# Patient Record
Sex: Male | Born: 1950 | Race: White | Hispanic: No | Marital: Married | Smoking: Never smoker
Health system: Southern US, Community
[De-identification: ages and names within clinical notes are randomized; demographics above are authoritative.]

## PROBLEM LIST (undated history)

## (undated) DIAGNOSIS — I10 Essential (primary) hypertension: Secondary | ICD-10-CM

## (undated) DIAGNOSIS — E785 Hyperlipidemia, unspecified: Secondary | ICD-10-CM

## (undated) HISTORY — PX: TONSILLECTOMY: SUR1361

## (undated) HISTORY — DX: Hyperlipidemia, unspecified: E78.5

## (undated) HISTORY — DX: Essential (primary) hypertension: I10

---

## 2003-12-13 ENCOUNTER — Other Ambulatory Visit: Payer: Self-pay

## 2003-12-22 ENCOUNTER — Ambulatory Visit: Payer: Self-pay | Admitting: Otolaryngology

## 2014-09-08 ENCOUNTER — Encounter: Payer: Self-pay | Admitting: Gastroenterology

## 2014-11-14 ENCOUNTER — Encounter: Payer: Self-pay | Admitting: Gastroenterology

## 2014-11-29 ENCOUNTER — Encounter: Payer: Self-pay | Admitting: Gastroenterology

## 2019-04-07 DIAGNOSIS — H5213 Myopia, bilateral: Secondary | ICD-10-CM | POA: Diagnosis not present

## 2019-04-08 DIAGNOSIS — I10 Essential (primary) hypertension: Secondary | ICD-10-CM | POA: Diagnosis not present

## 2019-04-08 DIAGNOSIS — R42 Dizziness and giddiness: Secondary | ICD-10-CM | POA: Diagnosis not present

## 2019-04-08 DIAGNOSIS — Z6828 Body mass index (BMI) 28.0-28.9, adult: Secondary | ICD-10-CM | POA: Diagnosis not present

## 2019-04-08 DIAGNOSIS — R002 Palpitations: Secondary | ICD-10-CM | POA: Diagnosis not present

## 2019-04-12 DIAGNOSIS — H2513 Age-related nuclear cataract, bilateral: Secondary | ICD-10-CM | POA: Diagnosis not present

## 2019-04-12 DIAGNOSIS — H18602 Keratoconus, unspecified, left eye: Secondary | ICD-10-CM | POA: Diagnosis not present

## 2019-04-12 DIAGNOSIS — H25013 Cortical age-related cataract, bilateral: Secondary | ICD-10-CM | POA: Diagnosis not present

## 2019-04-12 DIAGNOSIS — H25043 Posterior subcapsular polar age-related cataract, bilateral: Secondary | ICD-10-CM | POA: Diagnosis not present

## 2019-04-12 DIAGNOSIS — H18601 Keratoconus, unspecified, right eye: Secondary | ICD-10-CM | POA: Diagnosis not present

## 2019-04-13 DIAGNOSIS — Z0100 Encounter for examination of eyes and vision without abnormal findings: Secondary | ICD-10-CM | POA: Diagnosis not present

## 2019-04-29 ENCOUNTER — Ambulatory Visit: Payer: Self-pay | Attending: Internal Medicine

## 2019-05-10 DIAGNOSIS — Z0000 Encounter for general adult medical examination without abnormal findings: Secondary | ICD-10-CM | POA: Diagnosis not present

## 2019-05-10 DIAGNOSIS — E782 Mixed hyperlipidemia: Secondary | ICD-10-CM | POA: Diagnosis not present

## 2019-05-10 DIAGNOSIS — Z125 Encounter for screening for malignant neoplasm of prostate: Secondary | ICD-10-CM | POA: Diagnosis not present

## 2019-05-10 DIAGNOSIS — Z20828 Contact with and (suspected) exposure to other viral communicable diseases: Secondary | ICD-10-CM | POA: Diagnosis not present

## 2019-05-10 DIAGNOSIS — Z0184 Encounter for antibody response examination: Secondary | ICD-10-CM | POA: Diagnosis not present

## 2019-05-13 DIAGNOSIS — R739 Hyperglycemia, unspecified: Secondary | ICD-10-CM | POA: Diagnosis not present

## 2019-05-13 DIAGNOSIS — Z23 Encounter for immunization: Secondary | ICD-10-CM | POA: Diagnosis not present

## 2019-05-13 DIAGNOSIS — Z6830 Body mass index (BMI) 30.0-30.9, adult: Secondary | ICD-10-CM | POA: Diagnosis not present

## 2019-05-13 DIAGNOSIS — Z1211 Encounter for screening for malignant neoplasm of colon: Secondary | ICD-10-CM | POA: Diagnosis not present

## 2019-05-13 DIAGNOSIS — Z0000 Encounter for general adult medical examination without abnormal findings: Secondary | ICD-10-CM | POA: Diagnosis not present

## 2019-06-02 DIAGNOSIS — H25013 Cortical age-related cataract, bilateral: Secondary | ICD-10-CM | POA: Diagnosis not present

## 2019-06-02 DIAGNOSIS — H2512 Age-related nuclear cataract, left eye: Secondary | ICD-10-CM | POA: Diagnosis not present

## 2019-06-02 DIAGNOSIS — H25043 Posterior subcapsular polar age-related cataract, bilateral: Secondary | ICD-10-CM | POA: Diagnosis not present

## 2019-06-02 DIAGNOSIS — H2513 Age-related nuclear cataract, bilateral: Secondary | ICD-10-CM | POA: Diagnosis not present

## 2019-06-03 DIAGNOSIS — Z20828 Contact with and (suspected) exposure to other viral communicable diseases: Secondary | ICD-10-CM | POA: Diagnosis not present

## 2019-07-13 DIAGNOSIS — H25011 Cortical age-related cataract, right eye: Secondary | ICD-10-CM | POA: Diagnosis not present

## 2019-07-13 DIAGNOSIS — H2511 Age-related nuclear cataract, right eye: Secondary | ICD-10-CM | POA: Diagnosis not present

## 2019-07-13 DIAGNOSIS — Z961 Presence of intraocular lens: Secondary | ICD-10-CM | POA: Diagnosis not present

## 2019-07-13 DIAGNOSIS — H25041 Posterior subcapsular polar age-related cataract, right eye: Secondary | ICD-10-CM | POA: Diagnosis not present

## 2019-07-13 DIAGNOSIS — H25812 Combined forms of age-related cataract, left eye: Secondary | ICD-10-CM | POA: Diagnosis not present

## 2019-07-13 DIAGNOSIS — H2512 Age-related nuclear cataract, left eye: Secondary | ICD-10-CM | POA: Diagnosis not present

## 2019-07-20 DIAGNOSIS — H2511 Age-related nuclear cataract, right eye: Secondary | ICD-10-CM | POA: Diagnosis not present

## 2019-07-20 DIAGNOSIS — H25811 Combined forms of age-related cataract, right eye: Secondary | ICD-10-CM | POA: Diagnosis not present

## 2019-07-20 DIAGNOSIS — Z961 Presence of intraocular lens: Secondary | ICD-10-CM | POA: Diagnosis not present

## 2019-12-28 DIAGNOSIS — R079 Chest pain, unspecified: Secondary | ICD-10-CM | POA: Diagnosis not present

## 2019-12-28 DIAGNOSIS — I10 Essential (primary) hypertension: Secondary | ICD-10-CM | POA: Diagnosis not present

## 2019-12-28 DIAGNOSIS — M778 Other enthesopathies, not elsewhere classified: Secondary | ICD-10-CM | POA: Diagnosis not present

## 2019-12-28 DIAGNOSIS — R1310 Dysphagia, unspecified: Secondary | ICD-10-CM | POA: Diagnosis not present

## 2020-01-04 DIAGNOSIS — L814 Other melanin hyperpigmentation: Secondary | ICD-10-CM | POA: Diagnosis not present

## 2020-01-04 DIAGNOSIS — L578 Other skin changes due to chronic exposure to nonionizing radiation: Secondary | ICD-10-CM | POA: Diagnosis not present

## 2020-01-04 DIAGNOSIS — L821 Other seborrheic keratosis: Secondary | ICD-10-CM | POA: Diagnosis not present

## 2020-01-11 ENCOUNTER — Encounter: Payer: Self-pay | Admitting: Cardiology

## 2020-01-11 ENCOUNTER — Ambulatory Visit: Payer: Medicare HMO | Admitting: Cardiology

## 2020-01-11 ENCOUNTER — Other Ambulatory Visit: Payer: Self-pay

## 2020-01-11 VITALS — BP 150/82 | HR 60 | Resp 16 | Ht 68.0 in | Wt 206.0 lb

## 2020-01-11 DIAGNOSIS — E6609 Other obesity due to excess calories: Secondary | ICD-10-CM

## 2020-01-11 DIAGNOSIS — I10 Essential (primary) hypertension: Secondary | ICD-10-CM | POA: Diagnosis not present

## 2020-01-11 DIAGNOSIS — Z6831 Body mass index (BMI) 31.0-31.9, adult: Secondary | ICD-10-CM | POA: Diagnosis not present

## 2020-01-11 DIAGNOSIS — R072 Precordial pain: Secondary | ICD-10-CM

## 2020-01-11 NOTE — Progress Notes (Signed)
Date:  01/11/2020   ID:  Tony Simon, DOB 1951/02/09, MRN [id number]  PCP:  Fanny Bien, MD  Cardiologist:  Rex Kras, DO, [facility] (established care 01/11/2020)  REASON FOR CONSULT: Chest pain and pressure  REQUESTING PHYSICIAN:  Fanny Bien, [address] [place],  [place] [address]  Chief Complaint  Patient presents with  . Chest Pain  . New Patient (Initial Visit)    HPI  Tony Simon is a 69 y.o. male who presents to the office with a chief complaint of " chest pain and pressure." Patient's past medical history and cardiovascular risk factors include: Hypertension, advance age.   He is referred to the office at the request of Fanny Bien, MD for evaluation of chest pain/pressure.  Patient states that he has been experiencing substernal chest pain, his last episode was last Saturday on January 08, 2020, these episodes last for seconds, intermittent, describes it as tingling-like sensation, not brought on by effort related activities and does not resolve with rest.  Patient states that his father had a myocardial infarction and died at the age of 46 and since then has been cautious in regards to improving his modifiable cardiovascular risk factors and periodically gets exercise treadmill stress test for risk stratification.  He recently moved from [facility] and is here to establish care with cardiology.  He does not recall the cardiologist and he needs to see in the past.  FUNCTIONAL STATUS: Twice a week treadmill 54mn at 2.649m. Last year he would do it five days a week.    ALLERGIES: No Known Allergies  MEDICATION LIST PRIOR TO VISIT: Current Meds  Medication Sig  . aspirin EC 81 MG tablet Take 81 mg by mouth daily. Swallow whole.  . hydrochlorothiazide (HYDRODIURIL) 12.5 MG tablet Take 1 tablet by mouth daily.  . Marland Kitchenosartan (COZAAR) 100 MG tablet Take 1 tablet by mouth daily.  . meloxicam (MOBIC) 15 MG tablet as  needed.  . Multiple Vitamin (MULTIVITAMIN) tablet Take 1 tablet by mouth daily.     PAST MEDICAL HISTORY: Past Medical History:  Diagnosis Date  . Hyperlipidemia   . Hypertension     PAST SURGICAL HISTORY: Past Surgical History:  Procedure Laterality Date  . TONSILLECTOMY      FAMILY HISTORY: The patient family history includes Heart attack in his father.  SOCIAL HISTORY:  The patient  reports that he has never smoked. He has never used smokeless tobacco. He reports current alcohol use. He reports that he does not use drugs.  REVIEW OF SYSTEMS: Review of Systems  Constitutional: Negative for chills and fever.  HENT: Negative for hoarse voice and nosebleeds.   Eyes: Negative for discharge, double vision and pain.  Cardiovascular: Positive for chest pain. Negative for claudication, dyspnea on exertion, leg swelling, near-syncope, orthopnea, palpitations, paroxysmal nocturnal dyspnea and syncope.  Respiratory: Negative for hemoptysis and shortness of breath.   Musculoskeletal: Negative for muscle cramps and myalgias.  Gastrointestinal: Negative for abdominal pain, constipation, diarrhea, hematemesis, hematochezia, melena, nausea and vomiting.  Neurological: Negative for dizziness and light-headedness.    PHYSICAL EXAM: Vitals with BMI 01/11/2020  Height 5' 8"   Weight 206 lbs  BMI 3184.69Systolic 15629Diastolic 82  Pulse 60   CONSTITUTIONAL: Well-developed and well-nourished. No acute distress.  SKIN: Skin is warm and dry. No rash noted. No cyanosis. No pallor. No jaundice HEAD: Normocephalic and atraumatic.  EYES: No scleral icterus MOUTH/THROAT: Moist oral  membranes.  NECK: No JVD present. No thyromegaly noted. No carotid bruits  LYMPHATIC: No visible cervical adenopathy.  CHEST Normal respiratory effort. No intercostal retractions  LUNGS: Clear to auscultation bilaterally.  No stridor. No wheezes. No rales.  CARDIOVASCULAR: Regular rate and rhythm, positive S1-S2,  no murmurs rubs or gallops appreciated. ABDOMINAL: No apparent ascites.  EXTREMITIES: No peripheral edema  HEMATOLOGIC: No significant bruising NEUROLOGIC: Oriented to person, place, and time. Nonfocal. Normal muscle tone.  PSYCHIATRIC: Normal mood and affect. Normal behavior. Cooperative  CARDIAC DATABASE: EKG: 01/11/2020: Normal sinus rhythm, 60 bpm, poor R wave progression, consider old anterior infarct, nonspecific T wave abnormality, without underlying injury pattern.  Echocardiogram: No results found for this or any previous visit from the past 1095 days.    Stress Testing: No results found for this or any previous visit from the past 1095 days.   Heart Catheterization: NA.   LABORATORY DATA: External Labs: Collected: 12/29/2019  Creatinine 0.94 mg/dL. eGFR: >59 mL/min per 1.73 m Lipid profile: Total cholesterol 150, triglycerides 149, HDL 41, LDL 63 TSH: 2.64  IMPRESSION:    ICD-10-CM   1. Precordial chest pain  R07.2 EKG 12-Lead    PCV CARDIAC STRESS TEST    PCV ECHOCARDIOGRAM COMPLETE    SARS-COV-2 RNA,(COVID-19) QUAL NAAT  2. Benign hypertension  I10   3. Class 1 obesity due to excess calories without serious comorbidity with body mass index (BMI) of 31.0 to 31.9 in adult  E66.09    Z68.31      RECOMMENDATIONS: Tony Simon is a 69 y.o. male whose past medical history and cardiac risk factors include: Hypertension, advance age.   Precordial chest pain:  Patient symptoms of chest discomfort appear to be atypical in nature.  However given his family history would recommend exercise treadmill stress test to evaluate for exercise-induced ischemia.  Echocardiogram will be ordered to evaluate for structural heart disease and left ventricular systolic function.  Patient has been vaccinated for COVID-19 infection and has also received his booster.  However, because it is a GXT stress test would recommend screening for COVID-19 to decrease potential exposure  to healthcare providers.  Patient in agreement.  Benign essential hypertension:  Patient is office blood pressure not at goal.  Currently managed by PCP.  Medications reconciled.  He is asked to keep a log of his blood pressures and to review it with his next office visit to see if further medication titration is warranted.  Patient verbalized understanding.  Low-salt diet recommended  We also discussed undergoing coronary calcium score for risk stratification.  Patient would like to hold off on this at the current time.  Outside records independently reviewed and summarized findings noted above.  FINAL MEDICATION LIST END OF ENCOUNTER: No orders of the defined types were placed in this encounter.   There are no discontinued medications.   Current Outpatient Medications:  .  aspirin EC 81 MG tablet, Take 81 mg by mouth daily. Swallow whole., Disp: , Rfl:  .  hydrochlorothiazide (HYDRODIURIL) 12.5 MG tablet, Take 1 tablet by mouth daily., Disp: , Rfl:  .  losartan (COZAAR) 100 MG tablet, Take 1 tablet by mouth daily., Disp: , Rfl:  .  meloxicam (MOBIC) 15 MG tablet, as needed., Disp: , Rfl:  .  Multiple Vitamin (MULTIVITAMIN) tablet, Take 1 tablet by mouth daily., Disp: , Rfl:   Orders Placed This Encounter  Procedures  . SARS-COV-2 RNA,(COVID-19) QUAL NAAT  . PCV CARDIAC STRESS TEST  . EKG  12-Lead  . PCV ECHOCARDIOGRAM COMPLETE    There are no Patient Instructions on file for this visit.   --Continue cardiac medications as reconciled in final medication list. --Return in about 4 weeks (around 02/08/2020) for Reevaluation of, Chest pain, Review test results. Or sooner if needed. --Continue follow-up with your primary care physician regarding the management of your other chronic comorbid conditions.  Patient's questions and concerns were addressed to his satisfaction. He voices understanding of the instructions provided during this encounter.   This note was created using  a voice recognition software as a result there may be grammatical errors inadvertently enclosed that do not reflect the nature of this encounter. Every attempt is made to correct such errors.  Total time: 45 minutes greater than 50% of the time spent face-to-face.  Also reviewed outside records independently findings noted above, discussed disease management, coordination of care.  Rex Kras, [place], [facility]  Pager: [phone] Office: [phone]

## 2020-01-12 ENCOUNTER — Ambulatory Visit: Payer: Medicare HMO

## 2020-01-12 DIAGNOSIS — R072 Precordial pain: Secondary | ICD-10-CM | POA: Diagnosis not present

## 2020-01-17 DIAGNOSIS — Z03818 Encounter for observation for suspected exposure to other biological agents ruled out: Secondary | ICD-10-CM | POA: Diagnosis not present

## 2020-01-17 DIAGNOSIS — Z20822 Contact with and (suspected) exposure to covid-19: Secondary | ICD-10-CM | POA: Diagnosis not present

## 2020-01-19 ENCOUNTER — Other Ambulatory Visit: Payer: Self-pay

## 2020-01-19 ENCOUNTER — Ambulatory Visit: Payer: Medicare HMO

## 2020-01-19 DIAGNOSIS — R072 Precordial pain: Secondary | ICD-10-CM

## 2020-01-24 NOTE — Progress Notes (Signed)
Pt understood to keep f/u to go over stress test.

## 2020-01-24 NOTE — Progress Notes (Signed)
Called pt to inform him about his echo results. Pt understood

## 2020-02-07 ENCOUNTER — Other Ambulatory Visit: Payer: Self-pay

## 2020-02-07 ENCOUNTER — Ambulatory Visit: Payer: Medicare HMO | Admitting: Cardiology

## 2020-02-07 ENCOUNTER — Encounter: Payer: Self-pay | Admitting: Cardiology

## 2020-02-07 VITALS — BP 140/88 | HR 72 | Resp 16 | Ht 68.0 in | Wt 205.4 lb

## 2020-02-07 DIAGNOSIS — R072 Precordial pain: Secondary | ICD-10-CM | POA: Diagnosis not present

## 2020-02-07 DIAGNOSIS — I10 Essential (primary) hypertension: Secondary | ICD-10-CM

## 2020-02-07 DIAGNOSIS — Z712 Person consulting for explanation of examination or test findings: Secondary | ICD-10-CM

## 2020-02-07 DIAGNOSIS — E6609 Other obesity due to excess calories: Secondary | ICD-10-CM

## 2020-02-07 DIAGNOSIS — Z6831 Body mass index (BMI) 31.0-31.9, adult: Secondary | ICD-10-CM | POA: Diagnosis not present

## 2020-02-07 MED ORDER — METOPROLOL SUCCINATE ER 25 MG PO TB24: 25.0000 mg | ORAL_TABLET | Freq: Every day | ORAL | 0 refills | Status: DC

## 2020-02-07 NOTE — Progress Notes (Signed)
Date:  02/07/2020   ID:  Tony Simon, DOB [birth year], MRN [id number]  PCP:  Fanny Bien, MD  Cardiologist:  Rex Kras, DO, [facility] (established care 01/11/2020)  Date: 02/07/20 Last Office Visit: 01/11/2020  Chief Complaint  Patient presents with  . Chest Pain    Re-evaluation  . Results  . Follow-up    4 weeks    HPI  Tony Simon is a 69 y.o. male who presents to the office with a chief complaint of " reevaluation of chest pain and review test results." Patient's past medical history and cardiovascular risk factors include: Hypertension, advance age.   He is referred to the office at the request of Fanny Bien, MD for evaluation of chest pain/pressure. He recently moved from [facility] and is here to establish care with cardiology.  He does not recall the cardiologist and he needs to see in the past.  Since last office visit patient states that he no longer has substernal discomfort.  At times he has a tingling-like sensation over the left anterior precordial chest wall lasting for a few seconds, self-limiting, not brought on by effort related activities nor does it resolve with rest.  Patient's functional status remained stable and improving.  He walks 30 minutes a day on a treadmill at least 4-5 times per week.  He does not have any effort related anginal chest pain.  Since last office visit he underwent an echocardiogram and exercise treadmill stress test.  Echo noted preserved LVEF with normal diastolic filling pattern and no significant valvular heart disease.  Patient exercised for 9 minutes and 14 seconds on Bruce protocol achieved 10.5 METS and stress ECG was negative for ischemia.  He did have occasional PVCs on stress ECG.  FUNCTIONAL STATUS: 30 minutes of treadmill at least 4-5 times per week.  ALLERGIES: No Known Allergies  MEDICATION LIST PRIOR TO VISIT: Current Meds  Medication Sig  . aspirin EC 81 MG tablet Take 81 mg by  mouth daily. Swallow whole.  . hydrochlorothiazide (HYDRODIURIL) 12.5 MG tablet Take 1 tablet by mouth daily.  Marland Kitchen losartan (COZAAR) 100 MG tablet Take 1 tablet by mouth daily.  . meloxicam (MOBIC) 15 MG tablet as needed.  . Multiple Vitamin (MULTIVITAMIN) tablet Take 1 tablet by mouth daily.     PAST MEDICAL HISTORY: Past Medical History:  Diagnosis Date  . Hyperlipidemia   . Hypertension     PAST SURGICAL HISTORY: Past Surgical History:  Procedure Laterality Date  . TONSILLECTOMY      FAMILY HISTORY: The patient family history includes Heart attack in his father.  SOCIAL HISTORY:  The patient  reports that he has never smoked. He has never used smokeless tobacco. He reports current alcohol use. He reports that he does not use drugs.  REVIEW OF SYSTEMS: Review of Systems  Constitutional: Negative for chills and fever.  HENT: Negative for hoarse voice and nosebleeds.   Eyes: Negative for discharge, double vision and pain.  Cardiovascular: Negative for chest pain, claudication, dyspnea on exertion, leg swelling, near-syncope, orthopnea, palpitations, paroxysmal nocturnal dyspnea and syncope.  Respiratory: Negative for hemoptysis and shortness of breath.   Musculoskeletal: Negative for muscle cramps and myalgias.  Gastrointestinal: Negative for abdominal pain, constipation, diarrhea, hematemesis, hematochezia, melena, nausea and vomiting.  Neurological: Negative for dizziness and light-headedness.    PHYSICAL EXAM: Vitals with BMI 02/07/2020 02/07/2020 01/11/2020  Height - _0  _1   Weight - 205 lbs 6 oz 206 lbs  BMI - 90.38 33.38  Systolic 329 191 660  Diastolic 88 88 82  Pulse 72 72 60   CONSTITUTIONAL: Well-developed and well-nourished. No acute distress.  SKIN: Skin is warm and dry. No rash noted. No cyanosis. No pallor. No jaundice HEAD: Normocephalic and atraumatic.  EYES: No scleral icterus MOUTH/THROAT: Moist oral membranes.  NECK: No JVD present. No  thyromegaly noted. No carotid bruits  LYMPHATIC: No visible cervical adenopathy.  CHEST Normal respiratory effort. No intercostal retractions  LUNGS: Clear to auscultation bilaterally.  No stridor. No wheezes. No rales.  CARDIOVASCULAR: Regular rate and rhythm, positive S1-S2, no murmurs rubs or gallops appreciated. ABDOMINAL: No apparent ascites.  EXTREMITIES: No peripheral edema  HEMATOLOGIC: No significant bruising NEUROLOGIC: Oriented to person, place, and time. Nonfocal. Normal muscle tone.  PSYCHIATRIC: Normal mood and affect. Normal behavior. Cooperative  CARDIAC DATABASE: EKG: 01/11/2020: Normal sinus rhythm, 60 bpm, poor R wave progression, consider old anterior infarct, nonspecific T wave abnormality, without underlying injury pattern.  Echocardiogram: 01/12/2020:  Left ventricle cavity is normal in size and wall thickness. Normal global wall motion. Normal LV systolic function with EF 56%. Normal diastolic filling pattern.  No significant valvular abnormality.  Normal right atrial pressure.    Stress Testing: Treadmill Exercise Stress 01/19/2020:  Normal stress EKG. Resting ECG demonstrated normal sinus rhythm. Left QRS axis deviation. Peak ECG demonstrated no ST-T wave abnormalities. The patient exercised for 9 minutes and 14 seconds on Bruce protocol; achieved 10.52 METs at 91% of maximum predicted heart rate.  Occasional premature ventricular contractions noted on ECG at stress.  The heart rate response was normal.  The blood pressure response was normal. Baseline blood pressure was 152/100 mmHg and increased to 170/98 mmHg at peak exercise.  Chest pain is not present.   Heart Catheterization: NA.   LABORATORY DATA: External Labs: Collected: 12/29/2019  Creatinine 0.94 mg/dL. eGFR: >59 mL/min per 1.73 m Lipid profile: Total cholesterol 150, triglycerides 149, HDL 41, LDL 63 TSH: 2.64  IMPRESSION:    ICD-10-CM   1. Precordial chest pain  R07.2 metoprolol  succinate (TOPROL XL) 25 MG 24 hr tablet  2. Benign hypertension  I10   3. Class 1 obesity due to excess calories without serious comorbidity with body mass index (BMI) of 31.0 to 31.9 in adult  E66.09    Z68.31   4. Encounter to discuss test results  Z71.2      RECOMMENDATIONS: TAMEEM PULLARA is a 69 y.o. male whose past medical history and cardiac risk factors include: Hypertension, advance age.   Precordial chest pain: Resolved  Patient symptoms of chest discomfort appear to be atypical given his cardiovascular risk factors underwent an echocardiogram and treadmill stress test.  Echo noted preserved LVEF, normal diastolic function, no significant valvular heart disease.    Patient also underwent treadmill stress test which was negative for ischemia.    Since last visit he has increased his typical activity to 30 minutes of treadmill 4-5 times a week and no recurrence of effort related chest pain.    Educated on importance of risk factor modifications.    Given occasional PVCs noted on stress ECG recommend low-dose Toprol-XL.  Patient agreeable.  Will start Toprol-XL 25 mg p.o. every morning.  Medication profile discussed.  Benign essential hypertension:  Office blood pressure within acceptable range.  Currently managed by PCP.  Medications reconciled.  He is asked to keep a log of his blood pressures and to review it with his PCP at next  office visit.  Low-salt diet recommended  Independently reviewed the results of the echo and treadmill stress test with the patient at today's office visit.  His questions and concerns were addressed to his satisfaction.  Findings summarized above.  FINAL MEDICATION LIST END OF ENCOUNTER: Meds ordered this encounter  Medications  . metoprolol succinate (TOPROL XL) 25 MG 24 hr tablet    Sig: Take 1 tablet (25 mg total) by mouth daily.    Dispense:  30 tablet    Refill:  0    There are no discontinued medications.   Current  Outpatient Medications:  .  aspirin EC 81 MG tablet, Take 81 mg by mouth daily. Swallow whole., Disp: , Rfl:  .  hydrochlorothiazide (HYDRODIURIL) 12.5 MG tablet, Take 1 tablet by mouth daily., Disp: , Rfl:  .  losartan (COZAAR) 100 MG tablet, Take 1 tablet by mouth daily., Disp: , Rfl:  .  meloxicam (MOBIC) 15 MG tablet, as needed., Disp: , Rfl:  .  Multiple Vitamin (MULTIVITAMIN) tablet, Take 1 tablet by mouth daily., Disp: , Rfl:  .  metoprolol succinate (TOPROL XL) 25 MG 24 hr tablet, Take 1 tablet (25 mg total) by mouth daily., Disp: 30 tablet, Rfl: 0  No orders of the defined types were placed in this encounter.   There are no Patient Instructions on file for this visit.   --Continue cardiac medications as reconciled in final medication list. --Return in about 1 year (around 02/06/2021) for Primary prevention. . Or sooner if needed. --Continue follow-up with your primary care physician regarding the management of your other chronic comorbid conditions.  Patient's questions and concerns were addressed to his satisfaction. He voices understanding of the instructions provided during this encounter.   This note was created using a voice recognition software as a result there may be grammatical errors inadvertently enclosed that do not reflect the nature of this encounter. Every attempt is made to correct such errors.  Total time: 45 minutes greater than 50% of the time spent face-to-face.  Also reviewed outside records independently findings noted above, discussed disease management, coordination of care.  Rex Kras, [place], [facility]  Pager: [phone] Office: [phone]

## 2020-02-08 ENCOUNTER — Ambulatory Visit: Payer: Medicare HMO | Admitting: Cardiology

## 2020-02-28 DIAGNOSIS — R5383 Other fatigue: Secondary | ICD-10-CM | POA: Diagnosis not present

## 2020-02-28 DIAGNOSIS — R079 Chest pain, unspecified: Secondary | ICD-10-CM | POA: Diagnosis not present

## 2020-02-28 DIAGNOSIS — M19011 Primary osteoarthritis, right shoulder: Secondary | ICD-10-CM | POA: Diagnosis not present

## 2020-02-28 DIAGNOSIS — I10 Essential (primary) hypertension: Secondary | ICD-10-CM | POA: Diagnosis not present

## 2020-02-28 DIAGNOSIS — T781XXA Other adverse food reactions, not elsewhere classified, initial encounter: Secondary | ICD-10-CM | POA: Diagnosis not present

## 2020-02-28 DIAGNOSIS — K219 Gastro-esophageal reflux disease without esophagitis: Secondary | ICD-10-CM | POA: Diagnosis not present

## 2020-03-05 ENCOUNTER — Other Ambulatory Visit: Payer: Self-pay | Admitting: Cardiology

## 2020-03-05 DIAGNOSIS — R072 Precordial pain: Secondary | ICD-10-CM

## 2020-03-29 DIAGNOSIS — Z20822 Contact with and (suspected) exposure to covid-19: Secondary | ICD-10-CM | POA: Diagnosis not present

## 2020-04-04 ENCOUNTER — Other Ambulatory Visit: Payer: Self-pay | Admitting: Cardiology

## 2020-04-04 DIAGNOSIS — R072 Precordial pain: Secondary | ICD-10-CM

## 2020-06-20 DIAGNOSIS — K219 Gastro-esophageal reflux disease without esophagitis: Secondary | ICD-10-CM | POA: Diagnosis not present

## 2020-06-20 DIAGNOSIS — Z91018 Allergy to other foods: Secondary | ICD-10-CM | POA: Diagnosis not present

## 2020-06-20 DIAGNOSIS — I10 Essential (primary) hypertension: Secondary | ICD-10-CM | POA: Diagnosis not present

## 2020-06-27 DIAGNOSIS — Z0000 Encounter for general adult medical examination without abnormal findings: Secondary | ICD-10-CM | POA: Diagnosis not present

## 2020-06-27 DIAGNOSIS — Z125 Encounter for screening for malignant neoplasm of prostate: Secondary | ICD-10-CM | POA: Diagnosis not present

## 2020-06-29 DIAGNOSIS — A048 Other specified bacterial intestinal infections: Secondary | ICD-10-CM | POA: Diagnosis not present

## 2020-06-29 DIAGNOSIS — R079 Chest pain, unspecified: Secondary | ICD-10-CM | POA: Diagnosis not present

## 2020-06-29 DIAGNOSIS — Z1211 Encounter for screening for malignant neoplasm of colon: Secondary | ICD-10-CM | POA: Diagnosis not present

## 2020-06-29 DIAGNOSIS — M25511 Pain in right shoulder: Secondary | ICD-10-CM | POA: Diagnosis not present

## 2020-06-29 DIAGNOSIS — Z23 Encounter for immunization: Secondary | ICD-10-CM | POA: Diagnosis not present

## 2020-06-29 DIAGNOSIS — Z0000 Encounter for general adult medical examination without abnormal findings: Secondary | ICD-10-CM | POA: Diagnosis not present

## 2020-06-30 ENCOUNTER — Other Ambulatory Visit: Payer: Self-pay

## 2020-06-30 ENCOUNTER — Other Ambulatory Visit: Payer: Self-pay | Admitting: Family Medicine

## 2020-06-30 ENCOUNTER — Ambulatory Visit
Admission: RE | Admit: 2020-06-30 | Discharge: 2020-06-30 | Disposition: A | Discharge status: Home / Self Care | Payer: Medicare HMO | Source: Ambulatory Visit | Attending: Family Medicine | Admitting: Family Medicine

## 2020-06-30 DIAGNOSIS — M19011 Primary osteoarthritis, right shoulder: Secondary | ICD-10-CM | POA: Diagnosis not present

## 2020-06-30 DIAGNOSIS — M25511 Pain in right shoulder: Secondary | ICD-10-CM

## 2020-07-11 DIAGNOSIS — M19011 Primary osteoarthritis, right shoulder: Secondary | ICD-10-CM | POA: Diagnosis not present

## 2020-07-11 DIAGNOSIS — M7581 Other shoulder lesions, right shoulder: Secondary | ICD-10-CM | POA: Diagnosis not present

## 2020-09-27 DIAGNOSIS — M7581 Other shoulder lesions, right shoulder: Secondary | ICD-10-CM | POA: Diagnosis not present

## 2020-09-27 DIAGNOSIS — M545 Low back pain, unspecified: Secondary | ICD-10-CM | POA: Diagnosis not present

## 2020-09-27 DIAGNOSIS — I10 Essential (primary) hypertension: Secondary | ICD-10-CM | POA: Diagnosis not present

## 2020-11-13 DIAGNOSIS — R21 Rash and other nonspecific skin eruption: Secondary | ICD-10-CM | POA: Diagnosis not present

## 2020-12-05 DIAGNOSIS — N529 Male erectile dysfunction, unspecified: Secondary | ICD-10-CM | POA: Diagnosis not present

## 2020-12-05 DIAGNOSIS — I10 Essential (primary) hypertension: Secondary | ICD-10-CM | POA: Diagnosis not present

## 2021-01-04 DIAGNOSIS — L821 Other seborrheic keratosis: Secondary | ICD-10-CM | POA: Diagnosis not present

## 2021-01-04 DIAGNOSIS — L570 Actinic keratosis: Secondary | ICD-10-CM | POA: Diagnosis not present

## 2021-02-05 ENCOUNTER — Other Ambulatory Visit: Payer: Self-pay

## 2021-02-05 ENCOUNTER — Ambulatory Visit: Payer: Medicare HMO | Admitting: Cardiology

## 2021-02-05 ENCOUNTER — Encounter: Payer: Self-pay | Admitting: Cardiology

## 2021-02-05 VITALS — BP 138/87 | HR 53 | Temp 98.1°F | Resp 17 | Ht 68.0 in | Wt 212.0 lb

## 2021-02-05 DIAGNOSIS — E6609 Other obesity due to excess calories: Secondary | ICD-10-CM | POA: Diagnosis not present

## 2021-02-05 DIAGNOSIS — R001 Bradycardia, unspecified: Secondary | ICD-10-CM | POA: Diagnosis not present

## 2021-02-05 DIAGNOSIS — R072 Precordial pain: Secondary | ICD-10-CM

## 2021-02-05 DIAGNOSIS — Z6831 Body mass index (BMI) 31.0-31.9, adult: Secondary | ICD-10-CM | POA: Diagnosis not present

## 2021-02-05 DIAGNOSIS — I10 Essential (primary) hypertension: Secondary | ICD-10-CM | POA: Diagnosis not present

## 2021-02-05 MED ORDER — METOPROLOL SUCCINATE ER 25 MG PO TB24: 12.5000 mg | ORAL_TABLET | Freq: Every day | ORAL | 1 refills | Status: DC

## 2021-02-05 NOTE — Progress Notes (Signed)
Date:  02/05/2021   ID:  Burman Blacksmith, DOB Jan 28, 1951, MRN [id number]  PCP:  Fanny Bien, MD  Cardiologist:  Rex Kras, DO, [facility] (established care 01/11/2020)  Date: 02/05/21 Last Office Visit: 02/07/2020   Chief Complaint  Patient presents with   PRIMARY HYPERTENSION    1 YEAR   CHEST DISCOMFORT    HPI  Tony Simon is a 70 y.o. male who presents to the office with a chief complaint of " 1 year follow-up.." Patient's past medical history and cardiovascular risk factors include: Hypertension, advance age.   He is referred to the office at the request of Fanny Bien, MD for evaluation of chest pain/pressure  After moving from [facility] patient establish care with our practice for evaluation of chest pain and pressure.  Since establishing care he has undergone an echocardiogram and stress test as outlined below.  He comes in today for his annual follow-up visit.  He is doing well from a cardiovascular standpoint.  Last week Thursday patient was power washing his backyard and did well.  However for the last couple days he has been noticing left-sided precordial pain around the pectoral muscle.  The pain is nonexertional, and does not resolve with rest it usually self-limited and overall improving.  His overall functional status remains relatively stable without any exertional symptoms of chest pain or shortness of breath.  No hospitalizations or urgent care visits for cardiovascular symptoms since last office encounter.  FUNCTIONAL STATUS: 30 minutes of treadmill at least 4-5 times per week.  ALLERGIES: No Known Allergies  MEDICATION LIST PRIOR TO VISIT: Current Meds  Medication Sig   aspirin EC 81 MG tablet Take 81 mg by mouth daily. Swallow whole.   hydrochlorothiazide (HYDRODIURIL) 12.5 MG tablet Take 1 tablet by mouth daily.   ibuprofen (ADVIL) 800 MG tablet Take 1 tablet by mouth as needed.   losartan (COZAAR) 100 MG tablet Take  1 tablet by mouth daily.   meloxicam (MOBIC) 15 MG tablet as needed.   Multiple Vitamin (MULTIVITAMIN) tablet Take 1 tablet by mouth daily.   omeprazole (PRILOSEC) 40 MG capsule Take 40 mg by mouth as needed.   [DISCONTINUED] metoprolol succinate (TOPROL-XL) 25 MG 24 hr tablet TAKE 1 TABLET BY MOUTH EVERY DAY     PAST MEDICAL HISTORY: Past Medical History:  Diagnosis Date   Hyperlipidemia    Hypertension     PAST SURGICAL HISTORY: Past Surgical History:  Procedure Laterality Date   TONSILLECTOMY      FAMILY HISTORY: The patient family history includes Dementia (age of onset: 74) in his mother; Heart attack (age of onset: 2) in his father; Hypertension in his father.  SOCIAL HISTORY:  The patient  reports that he has never smoked. He has never used smokeless tobacco. He reports current alcohol use. He reports that he does not use drugs.  REVIEW OF SYSTEMS: Review of Systems  Constitutional: Negative for chills and fever.  HENT:  Negative for hoarse voice and nosebleeds.   Eyes:  Negative for discharge, double vision and pain.  Cardiovascular:  Negative for chest pain, claudication, dyspnea on exertion, leg swelling, near-syncope, orthopnea, palpitations, paroxysmal nocturnal dyspnea and syncope.  Respiratory:  Negative for hemoptysis and shortness of breath.   Musculoskeletal:  Negative for muscle cramps and myalgias.  Gastrointestinal:  Negative for abdominal pain, constipation, diarrhea, hematemesis, hematochezia, melena, nausea and vomiting.  Neurological:  Negative for dizziness and light-headedness.   PHYSICAL EXAM: Vitals with BMI  02/05/2021 02/05/2021 02/07/2020  Height - 5' 8"  -  Weight - 212 lbs -  BMI - 02.54 -  Systolic [phone]  Diastolic 87 90 88  Pulse 53 56 72   CONSTITUTIONAL: Well-developed and well-nourished. No acute distress.  SKIN: Skin is warm and dry. No rash noted. No cyanosis. No pallor. No jaundice HEAD: Normocephalic and atraumatic.   EYES: No scleral icterus MOUTH/THROAT: Moist oral membranes.  NECK: No JVD present. No thyromegaly noted. No carotid bruits  LYMPHATIC: No visible cervical adenopathy.  CHEST Normal respiratory effort. No intercostal retractions  LUNGS: Clear to auscultation bilaterally.  No stridor. No wheezes. No rales.  CARDIOVASCULAR: Regular rate and rhythm, positive S1-S2, no murmurs rubs or gallops appreciated. ABDOMINAL: Nonobese, soft, nontender, nondistended, positive bowel sounds in all 4 quadrants no apparent ascites.  EXTREMITIES: No peripheral edema, 2+ DP and PT pulses. HEMATOLOGIC: No significant bruising NEUROLOGIC: Oriented to person, place, and time. Nonfocal. Normal muscle tone.  PSYCHIATRIC: Normal mood and affect. Normal behavior. Cooperative  CARDIAC DATABASE: EKG: 02/05/2021: Sinus  Bradycardia, 50bpm, nonspecific T-abnormality.   Echocardiogram: 01/12/2020:  Left ventricle cavity is normal in size and wall thickness. Normal global wall motion. Normal LV systolic function with EF 56%. Normal diastolic filling pattern.  No significant valvular abnormality.  Normal right atrial pressure.    Stress Testing: Treadmill Exercise Stress 01/19/2020:  Normal stress EKG. Resting ECG demonstrated normal sinus rhythm. Left QRS axis deviation.  Peak ECG demonstrated no ST-T wave abnormalities. The patient exercised for 9 minutes and 14 seconds on Bruce protocol; achieved 10.52 METs at 91% of maximum predicted heart rate.  Occasional premature ventricular contractions noted on ECG at stress.  The heart rate response was normal.  The blood pressure response was normal. Baseline blood pressure was 152/100 mmHg and increased to 170/98 mmHg at peak exercise.  Chest pain is not present.   Heart Catheterization: NA.   LABORATORY DATA: External Labs: Collected: 12/29/2019  Creatinine 0.94 mg/dL. eGFR: >59 mL/min per 1.73 m Lipid profile: Total cholesterol 150, triglycerides 149, HDL 41,  LDL 63 TSH: 2.64  IMPRESSION:    ICD-10-CM   1. Precordial chest pain  R07.2 metoprolol succinate (TOPROL-XL) 25 MG 24 hr tablet    2. Sinus bradycardia  R00.1     3. Benign hypertension  I10 EKG 12-Lead    4. Class 1 obesity due to excess calories without serious comorbidity with body mass index (BMI) of 31.0 to 31.9 in adult  E66.09    Z68.31        RECOMMENDATIONS: Tony Simon is a 70 y.o. male whose past medical history and cardiac risk factors include: Hypertension, advance age.   Precordial chest pain Suggestive of musculoskeletal iliac etiology Overall improving Recently underwent an echo and stress test results reviewed during today's encounter and noted above for further reference. Recommend conservative management for now.  Sinus bradycardia: Asymptomatic Reduce the dose of Toprol-XL to 12.5 mg p.o. daily  Benign hypertension Office blood pressures are within acceptable range. Medications reconciled. Recommended the importance of a low-salt diet. Currently managed by primary care provider.  Class 1 obesity due to excess calories without serious comorbidity with body mass index (BMI) of 31.0 to 31.9 in adult Body mass index is 32.23 kg/m. I reviewed with the patient the importance of diet, regular physical activity/exercise, weight loss.   Patient is educated on increasing physical activity gradually as tolerated.  With the goal of moderate intensity exercise for 30 minutes a  day 5 days a week.   FINAL MEDICATION LIST END OF ENCOUNTER: Meds ordered this encounter  Medications   metoprolol succinate (TOPROL-XL) 25 MG 24 hr tablet    Sig: Take 0.5 tablets (12.5 mg total) by mouth daily.    Dispense:  45 tablet    Refill:  1    Medications Discontinued During This Encounter  Medication Reason   metoprolol succinate (TOPROL-XL) 25 MG 24 hr tablet      Current Outpatient Medications:    aspirin EC 81 MG tablet, Take 81 mg by mouth daily. Swallow  whole., Disp: , Rfl:    hydrochlorothiazide (HYDRODIURIL) 12.5 MG tablet, Take 1 tablet by mouth daily., Disp: , Rfl:    ibuprofen (ADVIL) 800 MG tablet, Take 1 tablet by mouth as needed., Disp: , Rfl:    losartan (COZAAR) 100 MG tablet, Take 1 tablet by mouth daily., Disp: , Rfl:    meloxicam (MOBIC) 15 MG tablet, as needed., Disp: , Rfl:    Multiple Vitamin (MULTIVITAMIN) tablet, Take 1 tablet by mouth daily., Disp: , Rfl:    omeprazole (PRILOSEC) 40 MG capsule, Take 40 mg by mouth as needed., Disp: , Rfl:    metoprolol succinate (TOPROL-XL) 25 MG 24 hr tablet, Take 0.5 tablets (12.5 mg total) by mouth daily., Disp: 45 tablet, Rfl: 1  Orders Placed This Encounter  Procedures   EKG 12-Lead     There are no Patient Instructions on file for this visit.   --Continue cardiac medications as reconciled in final medication list. --No follow-ups on file. Or sooner if needed. --Continue follow-up with your primary care physician regarding the management of your other chronic comorbid conditions.  Patient's questions and concerns were addressed to his satisfaction. He voices understanding of the instructions provided during this encounter.   This note was created using a voice recognition software as a result there may be grammatical errors inadvertently enclosed that do not reflect the nature of this encounter. Every attempt is made to correct such errors.  Total time: 45 minutes greater than 50% of the time spent face-to-face.  Also reviewed outside records independently findings noted above, discussed disease management, coordination of care.  Rex Kras, [place], [facility]  Pager: [phone] Office: [phone]

## 2021-02-06 ENCOUNTER — Ambulatory Visit: Payer: Medicare HMO | Admitting: Cardiology

## 2021-03-15 DIAGNOSIS — D045 Carcinoma in situ of skin of trunk: Secondary | ICD-10-CM | POA: Diagnosis not present

## 2021-03-15 DIAGNOSIS — L820 Inflamed seborrheic keratosis: Secondary | ICD-10-CM | POA: Diagnosis not present

## 2021-03-15 DIAGNOSIS — L570 Actinic keratosis: Secondary | ICD-10-CM | POA: Diagnosis not present

## 2021-06-28 DIAGNOSIS — Z125 Encounter for screening for malignant neoplasm of prostate: Secondary | ICD-10-CM | POA: Diagnosis not present

## 2021-06-28 DIAGNOSIS — Z114 Encounter for screening for human immunodeficiency virus [HIV]: Secondary | ICD-10-CM | POA: Diagnosis not present

## 2021-06-28 DIAGNOSIS — E669 Obesity, unspecified: Secondary | ICD-10-CM | POA: Diagnosis not present

## 2021-06-28 DIAGNOSIS — Z13220 Encounter for screening for lipoid disorders: Secondary | ICD-10-CM | POA: Diagnosis not present

## 2021-06-28 DIAGNOSIS — R69 Illness, unspecified: Secondary | ICD-10-CM | POA: Diagnosis not present

## 2021-06-28 DIAGNOSIS — I10 Essential (primary) hypertension: Secondary | ICD-10-CM | POA: Diagnosis not present

## 2021-06-28 DIAGNOSIS — Z0000 Encounter for general adult medical examination without abnormal findings: Secondary | ICD-10-CM | POA: Diagnosis not present

## 2021-07-02 DIAGNOSIS — Z0000 Encounter for general adult medical examination without abnormal findings: Secondary | ICD-10-CM | POA: Diagnosis not present

## 2021-07-02 DIAGNOSIS — E663 Overweight: Secondary | ICD-10-CM | POA: Diagnosis not present

## 2021-07-02 DIAGNOSIS — R7989 Other specified abnormal findings of blood chemistry: Secondary | ICD-10-CM | POA: Diagnosis not present

## 2021-07-02 DIAGNOSIS — Z23 Encounter for immunization: Secondary | ICD-10-CM | POA: Diagnosis not present

## 2021-07-02 DIAGNOSIS — Z1339 Encounter for screening examination for other mental health and behavioral disorders: Secondary | ICD-10-CM | POA: Diagnosis not present

## 2021-07-02 DIAGNOSIS — Z1331 Encounter for screening for depression: Secondary | ICD-10-CM | POA: Diagnosis not present

## 2021-07-02 DIAGNOSIS — R69 Illness, unspecified: Secondary | ICD-10-CM | POA: Diagnosis not present

## 2021-08-01 DIAGNOSIS — Z23 Encounter for immunization: Secondary | ICD-10-CM | POA: Diagnosis not present

## 2021-09-02 ENCOUNTER — Other Ambulatory Visit: Payer: Self-pay | Admitting: Cardiology

## 2021-09-02 DIAGNOSIS — R072 Precordial pain: Secondary | ICD-10-CM

## 2021-10-03 DIAGNOSIS — E782 Mixed hyperlipidemia: Secondary | ICD-10-CM | POA: Diagnosis not present

## 2021-10-03 DIAGNOSIS — I10 Essential (primary) hypertension: Secondary | ICD-10-CM | POA: Diagnosis not present

## 2021-10-09 DIAGNOSIS — R69 Illness, unspecified: Secondary | ICD-10-CM | POA: Diagnosis not present

## 2021-10-09 DIAGNOSIS — I10 Essential (primary) hypertension: Secondary | ICD-10-CM | POA: Diagnosis not present

## 2021-10-09 DIAGNOSIS — E782 Mixed hyperlipidemia: Secondary | ICD-10-CM | POA: Diagnosis not present

## 2021-10-09 DIAGNOSIS — R7989 Other specified abnormal findings of blood chemistry: Secondary | ICD-10-CM | POA: Diagnosis not present

## 2021-11-24 ENCOUNTER — Other Ambulatory Visit: Payer: Self-pay | Admitting: Cardiology

## 2021-11-24 DIAGNOSIS — R072 Precordial pain: Secondary | ICD-10-CM

## 2022-01-04 DIAGNOSIS — I10 Essential (primary) hypertension: Secondary | ICD-10-CM | POA: Diagnosis not present

## 2022-01-04 DIAGNOSIS — E559 Vitamin D deficiency, unspecified: Secondary | ICD-10-CM | POA: Diagnosis not present

## 2022-01-08 DIAGNOSIS — N529 Male erectile dysfunction, unspecified: Secondary | ICD-10-CM | POA: Diagnosis not present

## 2022-01-08 DIAGNOSIS — I10 Essential (primary) hypertension: Secondary | ICD-10-CM | POA: Diagnosis not present

## 2022-01-08 DIAGNOSIS — R7989 Other specified abnormal findings of blood chemistry: Secondary | ICD-10-CM | POA: Diagnosis not present

## 2022-01-08 DIAGNOSIS — R69 Illness, unspecified: Secondary | ICD-10-CM | POA: Diagnosis not present

## 2022-01-08 DIAGNOSIS — E559 Vitamin D deficiency, unspecified: Secondary | ICD-10-CM | POA: Diagnosis not present

## 2022-02-05 ENCOUNTER — Ambulatory Visit: Payer: Medicare HMO | Admitting: Cardiology

## 2022-03-01 ENCOUNTER — Ambulatory Visit: Payer: Medicare HMO | Admitting: Cardiology

## 2022-03-06 ENCOUNTER — Other Ambulatory Visit: Payer: Medicare HMO

## 2022-03-06 ENCOUNTER — Ambulatory Visit: Payer: Medicare HMO | Admitting: Cardiology

## 2022-03-06 ENCOUNTER — Encounter: Payer: Self-pay | Admitting: Cardiology

## 2022-03-06 VITALS — BP 134/68 | HR 58 | Resp 16 | Ht 68.0 in | Wt 212.4 lb

## 2022-03-06 DIAGNOSIS — Z6831 Body mass index (BMI) 31.0-31.9, adult: Secondary | ICD-10-CM | POA: Diagnosis not present

## 2022-03-06 DIAGNOSIS — I10 Essential (primary) hypertension: Secondary | ICD-10-CM | POA: Diagnosis not present

## 2022-03-06 DIAGNOSIS — E6609 Other obesity due to excess calories: Secondary | ICD-10-CM

## 2022-03-06 DIAGNOSIS — I493 Ventricular premature depolarization: Secondary | ICD-10-CM

## 2022-03-06 MED ORDER — METOPROLOL SUCCINATE ER 25 MG PO TB24: 25.0000 mg | ORAL_TABLET | Freq: Every day | ORAL | 0 refills | Status: DC

## 2022-03-06 NOTE — Progress Notes (Signed)
Date:  03/06/2022   ID:  Tony Simon, DOB June 07, 1950, MRN [id number]  PCP:  Fanny Bien, MD  Cardiologist:  Rex Kras, DO, [facility] (established care 01/11/2020)  Date: 03/06/22 Last Office Visit: 02/05/2021  Chief Complaint  Patient presents with   Follow-up    1 year    HPI  Tony Simon is a 71 y.o. male whose past medical history and cardiovascular risk factors include: Premature ventricular contractions, hypertension, advance age.   Referred to the practice in the past for evaluation of chest pain.  He underwent ischemic workup which was overall favorable and he presents today for 1 year follow-up visit.  Clinically he is doing well from a cardiovascular standpoint.  However, on an annual EKG today he is noted to have frequent PVCs.  He is currently taking Toprol-XL 12.5 mg p.o. daily.  He denies feeling tired, fatigued, lightheaded, dizzy, near-syncope or syncope.  FUNCTIONAL STATUS: 30 minutes of treadmill at least 4-5 times per week.  ALLERGIES: No Known Allergies  MEDICATION LIST PRIOR TO VISIT: Current Meds  Medication Sig   aspirin EC 81 MG tablet Take 81 mg by mouth daily. Swallow whole.   hydrochlorothiazide (HYDRODIURIL) 12.5 MG tablet Take 1 tablet by mouth daily.   ibuprofen (ADVIL) 800 MG tablet Take 1 tablet by mouth as needed.   losartan (COZAAR) 100 MG tablet Take 1 tablet by mouth daily.   meloxicam (MOBIC) 15 MG tablet as needed.   metoprolol succinate (TOPROL XL) 25 MG 24 hr tablet Take 1 tablet (25 mg total) by mouth daily.   Multiple Vitamin (MULTIVITAMIN) tablet Take 1 tablet by mouth daily.   omeprazole (PRILOSEC) 40 MG capsule Take 40 mg by mouth as needed.   sildenafil (VIAGRA) 100 MG tablet Take 100 mg by mouth daily as needed.     PAST MEDICAL HISTORY: Past Medical History:  Diagnosis Date   Hyperlipidemia    Hypertension     PAST SURGICAL HISTORY: Past Surgical History:  Procedure Laterality Date   TONSILLECTOMY       FAMILY HISTORY: The patient family history includes Dementia (age of onset: 60) in his mother; Heart attack (age of onset: 76) in his father; Hypertension in his father.  SOCIAL HISTORY:  The patient  reports that he has never smoked. He has never used smokeless tobacco. He reports current alcohol use. He reports that he does not use drugs.  REVIEW OF SYSTEMS: Review of Systems  Constitutional: Negative for chills and fever.  HENT:  Negative for hoarse voice and nosebleeds.   Eyes:  Negative for discharge, double vision and pain.  Cardiovascular:  Negative for chest pain, claudication, dyspnea on exertion, leg swelling, near-syncope, orthopnea, palpitations, paroxysmal nocturnal dyspnea and syncope.  Respiratory:  Negative for hemoptysis and shortness of breath.   Musculoskeletal:  Negative for muscle cramps and myalgias.  Gastrointestinal:  Negative for abdominal pain, constipation, diarrhea, hematemesis, hematochezia, melena, nausea and vomiting.  Neurological:  Negative for dizziness and light-headedness.    PHYSICAL EXAM:    03/06/2022   10:52 AM 02/05/2021   10:24 AM 02/05/2021   10:16 AM  Vitals with BMI  Height _0   _1   Weight 212 lbs 6 oz  212 lbs  BMI 92.9  24.46  Systolic 286 381 771  Diastolic 68 87 90  Pulse 58 53 56    Physical Exam  Constitutional: No distress.  Age appropriate, hemodynamically stable.   Neck: No JVD present.  Cardiovascular: Normal  rate, regular rhythm, S1 normal, S2 normal, intact distal pulses and normal pulses. Exam reveals no gallop, no S3 and no S4.  No murmur heard. Pulmonary/Chest: Effort normal and breath sounds normal. No stridor. He has no wheezes. He has no rales.  Abdominal: Soft. Bowel sounds are normal. He exhibits no distension. There is no abdominal tenderness.  Musculoskeletal:        General: No edema.     Cervical back: Neck supple.  Neurological: He is alert and oriented to person, place, and time. He has  intact cranial nerves (2-12).  Skin: Skin is warm and moist.    CARDIAC DATABASE: EKG: 03/06/2022: Normal sinus rhythm, 60 bpm, frequent PVCs.  Echocardiogram: 01/12/2020:  Left ventricle cavity is normal in size and wall thickness. Normal global wall motion. Normal LV systolic function with EF 56%. Normal diastolic filling pattern.  No significant valvular abnormality.  Normal right atrial pressure.    Stress Testing: Treadmill Exercise Stress 01/19/2020:  Normal stress EKG. Resting ECG demonstrated normal sinus rhythm. Left QRS axis deviation.  Peak ECG demonstrated no ST-T wave abnormalities. The patient exercised for 9 minutes and 14 seconds on Bruce protocol; achieved 10.52 METs at 91% of maximum predicted heart rate.  Occasional premature ventricular contractions noted on ECG at stress.  The heart rate response was normal.  The blood pressure response was normal. Baseline blood pressure was 152/100 mmHg and increased to 170/98 mmHg at peak exercise.  Chest pain is not present.   Heart Catheterization: NA.   LABORATORY DATA: External Labs: Collected: 12/29/2019  Creatinine 0.94 mg/dL. eGFR: >59 mL/min per 1.73 m Lipid profile: Total cholesterol 150, triglycerides 149, HDL 41, LDL 63 TSH: 2.64  IMPRESSION:    ICD-10-CM   1. Premature ventricular beat  I49.3 EKG 12-Lead    PCV ECHOCARDIOGRAM COMPLETE    LONG TERM MONITOR (3-14 DAYS)    metoprolol succinate (TOPROL XL) 25 MG 24 hr tablet    2. Benign hypertension  I10     3. Class 1 obesity due to excess calories without serious comorbidity with body mass index (BMI) of 31.0 to 31.9 in adult  E66.09    Z68.31        RECOMMENDATIONS: Tony Simon is a 71 y.o. male whose past medical history and cardiac risk factors include: Hypertension, advance age.   Premature ventricular beat Currently asymptomatic. I have asked him to continue Toprol-XL 12.5 mg p.o. daily. 3-day extended Holter monitor to evaluate for  PVC burden and/or other dysrhythmias. After the monitor is complete I have asked him to increase Toprol-XL to 25 mg p.o. daily (he used to be on this medication dose). Echo will be ordered to evaluate for structural heart disease and left ventricular systolic function. Would like to see him back in 6 weeks to discuss results to see if additional workup is warranted.  Benign hypertension Home and office blood pressure well-controlled. Medications reconciled.   No changes warranted. Currently managed by primary care provider.  Class 1 obesity due to excess calories without serious comorbidity with body mass index (BMI) of 32.0 to 32.9 in adult Body mass index is 32.3 kg/m. I reviewed with the patient the importance of diet, regular physical activity/exercise, weight loss.   Patient is educated on increasing physical activity gradually as tolerated.  With the goal of moderate intensity exercise for 30 minutes a day 5 days a week.  FINAL MEDICATION LIST END OF ENCOUNTER: Meds ordered this encounter  Medications   metoprolol  succinate (TOPROL XL) 25 MG 24 hr tablet    Sig: Take 1 tablet (25 mg total) by mouth daily.    Dispense:  90 tablet    Refill:  0    Medications Discontinued During This Encounter  Medication Reason   metoprolol succinate (TOPROL-XL) 25 MG 24 hr tablet Discontinued by provider     Current Outpatient Medications:    aspirin EC 81 MG tablet, Take 81 mg by mouth daily. Swallow whole., Disp: , Rfl:    hydrochlorothiazide (HYDRODIURIL) 12.5 MG tablet, Take 1 tablet by mouth daily., Disp: , Rfl:    ibuprofen (ADVIL) 800 MG tablet, Take 1 tablet by mouth as needed., Disp: , Rfl:    losartan (COZAAR) 100 MG tablet, Take 1 tablet by mouth daily., Disp: , Rfl:    meloxicam (MOBIC) 15 MG tablet, as needed., Disp: , Rfl:    metoprolol succinate (TOPROL XL) 25 MG 24 hr tablet, Take 1 tablet (25 mg total) by mouth daily., Disp: 90 tablet, Rfl: 0   Multiple Vitamin  (MULTIVITAMIN) tablet, Take 1 tablet by mouth daily., Disp: , Rfl:    omeprazole (PRILOSEC) 40 MG capsule, Take 40 mg by mouth as needed., Disp: , Rfl:    sildenafil (VIAGRA) 100 MG tablet, Take 100 mg by mouth daily as needed., Disp: , Rfl:   Orders Placed This Encounter  Procedures   LONG TERM MONITOR (3-14 DAYS)   EKG 12-Lead   PCV ECHOCARDIOGRAM COMPLETE     There are no Patient Instructions on file for this visit.   --Continue cardiac medications as reconciled in final medication list. --Return in about 6 weeks (around 04/17/2022) for Follow up PVC and review monitor /echo, EKG on arrival . Or sooner if needed. --Continue follow-up with your primary care physician regarding the management of your other chronic comorbid conditions.  Patient's questions and concerns were addressed to his satisfaction. He voices understanding of the instructions provided during this encounter.   This note was created using a voice recognition software as a result there may be grammatical errors inadvertently enclosed that do not reflect the nature of this encounter. Every attempt is made to correct such errors.  Rex Kras, [place], [facility]  Pager: [phone] Office: [phone]

## 2022-03-14 DIAGNOSIS — I493 Ventricular premature depolarization: Secondary | ICD-10-CM | POA: Diagnosis not present

## 2022-03-20 DIAGNOSIS — M545 Low back pain, unspecified: Secondary | ICD-10-CM | POA: Diagnosis not present

## 2022-03-20 DIAGNOSIS — I10 Essential (primary) hypertension: Secondary | ICD-10-CM | POA: Diagnosis not present

## 2022-03-20 DIAGNOSIS — I493 Ventricular premature depolarization: Secondary | ICD-10-CM | POA: Diagnosis not present

## 2022-03-21 ENCOUNTER — Ambulatory Visit: Payer: Medicare HMO

## 2022-03-21 ENCOUNTER — Other Ambulatory Visit: Payer: Self-pay | Admitting: Family Medicine

## 2022-03-21 DIAGNOSIS — I493 Ventricular premature depolarization: Secondary | ICD-10-CM

## 2022-03-21 DIAGNOSIS — I10 Essential (primary) hypertension: Secondary | ICD-10-CM

## 2022-03-26 NOTE — Progress Notes (Signed)
LMTCB

## 2022-03-26 NOTE — Progress Notes (Signed)
Reviewed with patient. He verbalized understanding.

## 2022-04-03 DIAGNOSIS — R69 Illness, unspecified: Secondary | ICD-10-CM | POA: Diagnosis not present

## 2022-04-18 ENCOUNTER — Ambulatory Visit: Payer: Medicare HMO | Admitting: Internal Medicine

## 2022-04-18 VITALS — BP 159/103 | HR 64 | Ht 68.0 in | Wt 210.6 lb

## 2022-04-18 DIAGNOSIS — I493 Ventricular premature depolarization: Secondary | ICD-10-CM | POA: Diagnosis not present

## 2022-04-18 DIAGNOSIS — I10 Essential (primary) hypertension: Secondary | ICD-10-CM | POA: Diagnosis not present

## 2022-04-18 NOTE — Progress Notes (Signed)
Date:  04/18/2022   ID:  Tony Simon, DOB 1950/04/10, MRN [id number]  PCP:  Fanny Bien, MD  Cardiologist:  Rex Kras, DO, [facility] (established care 01/11/2020)  Date: 04/18/22 Last Office Visit: 02/05/2021  Chief Complaint  Patient presents with   Premature ventricular beat   Follow-up   Results    HPI  Tony Simon is a 72 y.o. male whose past medical history and cardiovascular risk factors include: Premature ventricular contractions, hypertension, advance age.   Referred to the practice in the past for evaluation of chest pain with negative ischemic workup. Patient has been feeling really good. No complaints or concerns today. He is still exercising without feeling short of breath of developing any chest pain. Patient admits he has not yet taken his blood pressure pills this morning and that is why his pressure is high right now. At home, he says his blood pressure is always controlled. He denies feeling tired, fatigued, lightheaded, dizzy, near-syncope or syncope.  FUNCTIONAL STATUS: 30 minutes of treadmill at least 4-5 times per week.  ALLERGIES: No Known Allergies  MEDICATION LIST PRIOR TO VISIT: Current Meds  Medication Sig   aspirin EC 81 MG tablet Take 81 mg by mouth daily. Swallow whole.   hydrochlorothiazide (HYDRODIURIL) 12.5 MG tablet Take 1 tablet by mouth daily.   ibuprofen (ADVIL) 800 MG tablet Take 1 tablet by mouth as needed.   losartan (COZAAR) 100 MG tablet Take 1 tablet by mouth daily.   meloxicam (MOBIC) 15 MG tablet as needed.   metoprolol succinate (TOPROL XL) 25 MG 24 hr tablet Take 1 tablet (25 mg total) by mouth daily.   Multiple Vitamin (MULTIVITAMIN) tablet Take 1 tablet by mouth daily.   omeprazole (PRILOSEC) 40 MG capsule Take 40 mg by mouth as needed.   sildenafil (VIAGRA) 100 MG tablet Take 100 mg by mouth daily as needed.   [DISCONTINUED] cyclobenzaprine (FLEXERIL) 5 MG tablet Take 5-10 mg by mouth at bedtime.     PAST  MEDICAL HISTORY: Past Medical History:  Diagnosis Date   Hyperlipidemia    Hypertension     PAST SURGICAL HISTORY: Past Surgical History:  Procedure Laterality Date   TONSILLECTOMY      FAMILY HISTORY: The patient family history includes Dementia (age of onset: 27) in his mother; Heart attack (age of onset: 60) in his father; Hypertension in his father.  SOCIAL HISTORY:  The patient  reports that he has never smoked. He has never used smokeless tobacco. He reports current alcohol use. He reports that he does not use drugs.  REVIEW OF SYSTEMS: Review of Systems  Constitutional: Negative for chills and fever.  HENT:  Negative for hoarse voice and nosebleeds.   Eyes:  Negative for discharge, double vision and pain.  Cardiovascular:  Negative for chest pain, claudication, dyspnea on exertion, leg swelling, near-syncope, orthopnea, palpitations, paroxysmal nocturnal dyspnea and syncope.  Respiratory:  Negative for hemoptysis and shortness of breath.   Musculoskeletal:  Negative for muscle cramps and myalgias.  Gastrointestinal:  Negative for abdominal pain, constipation, diarrhea, hematemesis, hematochezia, melena, nausea and vomiting.  Neurological:  Negative for dizziness and light-headedness.    PHYSICAL EXAM:    04/18/2022   11:03 AM 04/18/2022   10:53 AM 03/06/2022   10:52 AM  Vitals with BMI  Height  5\' 8"  5\' 8"   Weight  210 lbs 10 oz 212 lbs 6 oz  BMI  67.34 19.3  Systolic 790 240 973  Diastolic 532 992  68  Pulse 64 61 58    Physical Exam  Constitutional: No distress.  Age appropriate, hemodynamically stable.   Neck: No JVD present.  Cardiovascular: Normal rate, regular rhythm, S1 normal, S2 normal, intact distal pulses and normal pulses. Exam reveals no gallop, no S3 and no S4.  No murmur heard. Pulmonary/Chest: Effort normal and breath sounds normal. No stridor. He has no wheezes. He has no rales.  Abdominal: Soft. Bowel sounds are normal. He exhibits no  distension. There is no abdominal tenderness.  Musculoskeletal:        General: No edema.     Cervical back: Neck supple.  Neurological: He is alert and oriented to person, place, and time. He has intact cranial nerves (2-12).  Skin: Skin is warm and moist.    CARDIAC DATABASE: EKG: 03/06/2022: Normal sinus rhythm, 60 bpm, frequent PVCs. 04/18/2022: sinus bradycardia, rate 59. No ischemia  Echocardiogram: 01/12/2020:  Left ventricle cavity is normal in size and wall thickness. Normal global wall motion. Normal LV systolic function with EF 56%. Normal diastolic filling pattern.  No significant valvular abnormality.  Normal right atrial pressure.    Echocardiogram 03/21/2022:  Normal LV systolic function with visual EF 60-65%. Left ventricle cavity  is normal in size. Normal left ventricular wall thickness. Normal global  wall motion. Normal diastolic filling pattern, normal LAP.  No significant valvular heart disease.  Compared to 01/12/2020 no significant change.   Stress Testing: Treadmill Exercise Stress 01/19/2020:  Normal stress EKG. Resting ECG demonstrated normal sinus rhythm. Left QRS axis deviation.  Peak ECG demonstrated no ST-T wave abnormalities. The patient exercised for 9 minutes and 14 seconds on Bruce protocol; achieved 10.52 METs at 91% of maximum predicted heart rate.  Occasional premature ventricular contractions noted on ECG at stress.  The heart rate response was normal.  The blood pressure response was normal. Baseline blood pressure was 152/100 mmHg and increased to 170/98 mmHg at peak exercise.  Chest pain is not present.   Heart Catheterization: NA.   LABORATORY DATA: External Labs: Collected: 12/29/2019  Creatinine 0.94 mg/dL. eGFR: >59 mL/min per 1.73 m Lipid profile: Total cholesterol 150, triglycerides 149, HDL 41, LDL 63 TSH: 2.64  IMPRESSION:    ICD-10-CM   1. Premature ventricular beat  I49.3 EKG 12-Lead    2. Essential hypertension  I10         RECOMMENDATIONS: Tony Simon is a 72 y.o. male whose past medical history and cardiac risk factors include: Hypertension, advance age.   Premature ventricular beat Very well controlled with metoprolol  Benign hypertension Home blood pressure well-controlled. He forgot to take his meds this morning but at home BP is always <130/80 Medications reconciled.   No changes warranted. Follow-up in 3 months or sooner if needed    FINAL MEDICATION LIST END OF ENCOUNTER: No orders of the defined types were placed in this encounter.   Medications Discontinued During This Encounter  Medication Reason   cyclobenzaprine (FLEXERIL) 5 MG tablet Completed Course     Current Outpatient Medications:    aspirin EC 81 MG tablet, Take 81 mg by mouth daily. Swallow whole., Disp: , Rfl:    hydrochlorothiazide (HYDRODIURIL) 12.5 MG tablet, Take 1 tablet by mouth daily., Disp: , Rfl:    ibuprofen (ADVIL) 800 MG tablet, Take 1 tablet by mouth as needed., Disp: , Rfl:    losartan (COZAAR) 100 MG tablet, Take 1 tablet by mouth daily., Disp: , Rfl:    meloxicam (MOBIC) 15  MG tablet, as needed., Disp: , Rfl:    metoprolol succinate (TOPROL XL) 25 MG 24 hr tablet, Take 1 tablet (25 mg total) by mouth daily., Disp: 90 tablet, Rfl: 0   Multiple Vitamin (MULTIVITAMIN) tablet, Take 1 tablet by mouth daily., Disp: , Rfl:    omeprazole (PRILOSEC) 40 MG capsule, Take 40 mg by mouth as needed., Disp: , Rfl:    sildenafil (VIAGRA) 100 MG tablet, Take 100 mg by mouth daily as needed., Disp: , Rfl:   Orders Placed This Encounter  Procedures   EKG 12-Lead       Floydene Flock, DO, [facility]  Pager: [phone] Office: [phone]

## 2022-04-19 ENCOUNTER — Ambulatory Visit
Admission: RE | Admit: 2022-04-19 | Discharge: 2022-04-19 | Disposition: A | Discharge status: Home / Self Care | Payer: No Typology Code available for payment source | Source: Ambulatory Visit | Attending: Family Medicine | Admitting: Family Medicine

## 2022-04-19 DIAGNOSIS — I10 Essential (primary) hypertension: Secondary | ICD-10-CM

## 2022-04-22 DIAGNOSIS — L578 Other skin changes due to chronic exposure to nonionizing radiation: Secondary | ICD-10-CM | POA: Diagnosis not present

## 2022-04-22 DIAGNOSIS — D485 Neoplasm of uncertain behavior of skin: Secondary | ICD-10-CM | POA: Diagnosis not present

## 2022-04-22 DIAGNOSIS — L820 Inflamed seborrheic keratosis: Secondary | ICD-10-CM | POA: Diagnosis not present

## 2022-04-22 DIAGNOSIS — D225 Melanocytic nevi of trunk: Secondary | ICD-10-CM | POA: Diagnosis not present

## 2022-04-22 DIAGNOSIS — D2261 Melanocytic nevi of right upper limb, including shoulder: Secondary | ICD-10-CM | POA: Diagnosis not present

## 2022-04-22 DIAGNOSIS — D224 Melanocytic nevi of scalp and neck: Secondary | ICD-10-CM | POA: Diagnosis not present

## 2022-04-22 DIAGNOSIS — L570 Actinic keratosis: Secondary | ICD-10-CM | POA: Diagnosis not present

## 2022-04-22 DIAGNOSIS — D2272 Melanocytic nevi of left lower limb, including hip: Secondary | ICD-10-CM | POA: Diagnosis not present

## 2022-04-22 DIAGNOSIS — L821 Other seborrheic keratosis: Secondary | ICD-10-CM | POA: Diagnosis not present

## 2022-04-22 DIAGNOSIS — L430 Hypertrophic lichen planus: Secondary | ICD-10-CM | POA: Diagnosis not present

## 2022-04-22 DIAGNOSIS — L905 Scar conditions and fibrosis of skin: Secondary | ICD-10-CM | POA: Diagnosis not present

## 2022-04-22 DIAGNOSIS — D2262 Melanocytic nevi of left upper limb, including shoulder: Secondary | ICD-10-CM | POA: Diagnosis not present

## 2022-04-22 DIAGNOSIS — D2271 Melanocytic nevi of right lower limb, including hip: Secondary | ICD-10-CM | POA: Diagnosis not present

## 2022-05-01 DIAGNOSIS — E782 Mixed hyperlipidemia: Secondary | ICD-10-CM | POA: Diagnosis not present

## 2022-05-01 DIAGNOSIS — I10 Essential (primary) hypertension: Secondary | ICD-10-CM | POA: Diagnosis not present

## 2022-05-01 DIAGNOSIS — M545 Low back pain, unspecified: Secondary | ICD-10-CM | POA: Diagnosis not present

## 2022-05-12 DIAGNOSIS — I493 Ventricular premature depolarization: Secondary | ICD-10-CM | POA: Diagnosis not present

## 2022-05-15 NOTE — Progress Notes (Signed)
Gave patient this information. He acknowledged understanding and agreed to continue taking Toprol-XL 12.'5mg'$ 

## 2022-07-08 DIAGNOSIS — Z125 Encounter for screening for malignant neoplasm of prostate: Secondary | ICD-10-CM | POA: Diagnosis not present

## 2022-07-08 DIAGNOSIS — R69 Illness, unspecified: Secondary | ICD-10-CM | POA: Diagnosis not present

## 2022-07-08 DIAGNOSIS — D696 Thrombocytopenia, unspecified: Secondary | ICD-10-CM | POA: Diagnosis not present

## 2022-07-08 DIAGNOSIS — Z0000 Encounter for general adult medical examination without abnormal findings: Secondary | ICD-10-CM | POA: Diagnosis not present

## 2022-07-08 DIAGNOSIS — Z114 Encounter for screening for human immunodeficiency virus [HIV]: Secondary | ICD-10-CM | POA: Diagnosis not present

## 2022-07-08 DIAGNOSIS — Z13220 Encounter for screening for lipoid disorders: Secondary | ICD-10-CM | POA: Diagnosis not present

## 2022-07-12 DIAGNOSIS — M545 Low back pain, unspecified: Secondary | ICD-10-CM | POA: Diagnosis not present

## 2022-07-12 DIAGNOSIS — E782 Mixed hyperlipidemia: Secondary | ICD-10-CM | POA: Diagnosis not present

## 2022-07-12 DIAGNOSIS — I10 Essential (primary) hypertension: Secondary | ICD-10-CM | POA: Diagnosis not present

## 2022-07-18 ENCOUNTER — Encounter: Payer: Self-pay | Admitting: Internal Medicine

## 2022-07-18 ENCOUNTER — Ambulatory Visit: Payer: Medicare HMO | Admitting: Cardiology

## 2022-07-18 ENCOUNTER — Ambulatory Visit: Payer: Medicare HMO | Admitting: Internal Medicine

## 2022-07-18 VITALS — BP 145/92 | HR 52 | Ht 68.0 in | Wt 208.0 lb

## 2022-07-18 DIAGNOSIS — I493 Ventricular premature depolarization: Secondary | ICD-10-CM | POA: Diagnosis not present

## 2022-07-18 DIAGNOSIS — I10 Essential (primary) hypertension: Secondary | ICD-10-CM | POA: Diagnosis not present

## 2022-07-18 NOTE — Progress Notes (Signed)
Date:  07/19/2022   ID:  Tony Simon, DOB 1950-12-15, MRN [id number]  PCP:  Lewis Moccasin, MD  Cardiologist:  Tessa Lerner, DO, [facility] (established care 01/11/2020)  Date: 07/19/22 Last Office Visit: 02/05/2021  Chief Complaint  Patient presents with   Premature ventricular beat   Follow-up    HPI  Tony Simon is a 72 y.o. male whose past medical history and cardiovascular risk factors include: Premature ventricular contractions, hypertension, advance age.   Referred to the practice in the past for evaluation of chest pain with negative ischemic workup. Patient has been feeling really good. No complaints or concerns today. He denies chest pain or shortness of breath with activity. His blood pressure is always slightly elevated here but better controlled at home. Denies chest pain, shortness of breath, palpitations, diaphoresis, syncope, edema, PND, orthopnea.   FUNCTIONAL STATUS: 30 minutes of treadmill at least 4-5 times per week.  ALLERGIES: No Known Allergies  MEDICATION LIST PRIOR TO VISIT: Current Meds  Medication Sig   aspirin EC 81 MG tablet Take 81 mg by mouth daily. Swallow whole.   hydrochlorothiazide (HYDRODIURIL) 12.5 MG tablet Take 1 tablet by mouth daily.   ibuprofen (ADVIL) 800 MG tablet Take 1 tablet by mouth as needed.   losartan (COZAAR) 100 MG tablet Take 1 tablet by mouth daily.   meloxicam (MOBIC) 15 MG tablet as needed.   metoprolol succinate (TOPROL XL) 25 MG 24 hr tablet Take 1 tablet (25 mg total) by mouth daily.   Multiple Vitamin (MULTIVITAMIN) tablet Take 1 tablet by mouth daily.   omeprazole (PRILOSEC) 40 MG capsule Take 40 mg by mouth as needed.   rosuvastatin (CRESTOR) 5 MG tablet Take 5 mg by mouth daily.   sildenafil (VIAGRA) 100 MG tablet Take 100 mg by mouth daily as needed.     PAST MEDICAL HISTORY: Past Medical History:  Diagnosis Date   Hyperlipidemia    Hypertension     PAST SURGICAL HISTORY: Past Surgical History:   Procedure Laterality Date   TONSILLECTOMY      FAMILY HISTORY: The patient family history includes Dementia (age of onset: 16) in his mother; Heart attack (age of onset: 30) in his father; Hypertension in his father.  SOCIAL HISTORY:  The patient  reports that he has never smoked. He has never used smokeless tobacco. He reports current alcohol use. He reports that he does not use drugs.  REVIEW OF SYSTEMS: Review of Systems  Constitutional: Negative for chills and fever.  HENT:  Negative for hoarse voice and nosebleeds.   Eyes:  Negative for discharge, double vision and pain.  Cardiovascular:  Negative for chest pain, claudication, dyspnea on exertion, leg swelling, near-syncope, orthopnea, palpitations, paroxysmal nocturnal dyspnea and syncope.  Respiratory:  Negative for hemoptysis and shortness of breath.   Musculoskeletal:  Negative for muscle cramps and myalgias.  Gastrointestinal:  Negative for abdominal pain, constipation, diarrhea, hematemesis, hematochezia, melena, nausea and vomiting.  Neurological:  Negative for dizziness and light-headedness.    PHYSICAL EXAM:    07/18/2022   11:26 AM 07/18/2022   11:22 AM 04/18/2022   11:03 AM  Vitals with BMI  Height  5\' 8"    Weight  208 lbs   BMI  31.63   Systolic 145 159 409  Diastolic 92 91 103  Pulse 52 51 64    Physical Exam  Constitutional: No distress.  Age appropriate, hemodynamically stable.   Neck: No JVD present.  Cardiovascular: Normal rate, regular rhythm,  S1 normal, S2 normal, intact distal pulses and normal pulses. Exam reveals no gallop, no S3 and no S4.  No murmur heard. Pulmonary/Chest: Effort normal and breath sounds normal. No stridor. He has no wheezes. He has no rales.  Abdominal: Soft. Bowel sounds are normal. He exhibits no distension. There is no abdominal tenderness.  Musculoskeletal:        General: No edema.     Cervical back: Neck supple.  Neurological: He is alert and oriented to person, place,  and time. He has intact cranial nerves (2-12).  Skin: Skin is warm and moist.    CARDIAC DATABASE: EKG: 03/06/2022: Normal sinus rhythm, 60 bpm, frequent PVCs. 04/18/2022: sinus bradycardia, rate 59. No ischemia  Echocardiogram: 01/12/2020:  Left ventricle cavity is normal in size and wall thickness. Normal global wall motion. Normal LV systolic function with EF 56%. Normal diastolic filling pattern.  No significant valvular abnormality.  Normal right atrial pressure.    Echocardiogram 03/21/2022:  Normal LV systolic function with visual EF 60-65%. Left ventricle cavity  is normal in size. Normal left ventricular wall thickness. Normal global  wall motion. Normal diastolic filling pattern, normal LAP.  No significant valvular heart disease.  Compared to 01/12/2020 no significant change.   Stress Testing: Treadmill Exercise Stress 01/19/2020:  Normal stress EKG. Resting ECG demonstrated normal sinus rhythm. Left QRS axis deviation.  Peak ECG demonstrated no ST-T wave abnormalities. The patient exercised for 9 minutes and 14 seconds on Bruce protocol; achieved 10.52 METs at 91% of maximum predicted heart rate.  Occasional premature ventricular contractions noted on ECG at stress.  The heart rate response was normal.  The blood pressure response was normal. Baseline blood pressure was 152/100 mmHg and increased to 170/98 mmHg at peak exercise.  Chest pain is not present.   Heart Catheterization: NA.   LABORATORY DATA: External Labs: Collected: 12/29/2019  Creatinine 0.94 mg/dL. eGFR: >59 mL/min per 1.73 m Lipid profile: Total cholesterol 150, triglycerides 149, HDL 41, LDL 63 TSH: 2.64  IMPRESSION:    ICD-10-CM   1. Premature ventricular beat  I49.3     2. Essential hypertension  I10        RECOMMENDATIONS: DELANCEY TALAMO is a 72 y.o. male whose past medical history and cardiac risk factors include: Hypertension, advance age.   Premature ventricular beat Very well  controlled with metoprolol Denies palpitations  Benign hypertension Home blood pressure well-controlled. Continue current cardiac medications. Encourage low-sodium diet, less than 2000 mg daily. Follow-up in 6 months or sooner if needed.    FINAL MEDICATION LIST END OF ENCOUNTER: No orders of the defined types were placed in this encounter.   There are no discontinued medications.    Current Outpatient Medications:    aspirin EC 81 MG tablet, Take 81 mg by mouth daily. Swallow whole., Disp: , Rfl:    hydrochlorothiazide (HYDRODIURIL) 12.5 MG tablet, Take 1 tablet by mouth daily., Disp: , Rfl:    ibuprofen (ADVIL) 800 MG tablet, Take 1 tablet by mouth as needed., Disp: , Rfl:    losartan (COZAAR) 100 MG tablet, Take 1 tablet by mouth daily., Disp: , Rfl:    meloxicam (MOBIC) 15 MG tablet, as needed., Disp: , Rfl:    metoprolol succinate (TOPROL XL) 25 MG 24 hr tablet, Take 1 tablet (25 mg total) by mouth daily., Disp: 90 tablet, Rfl: 0   Multiple Vitamin (MULTIVITAMIN) tablet, Take 1 tablet by mouth daily., Disp: , Rfl:    omeprazole (PRILOSEC) 40  MG capsule, Take 40 mg by mouth as needed., Disp: , Rfl:    rosuvastatin (CRESTOR) 5 MG tablet, Take 5 mg by mouth daily., Disp: , Rfl:    sildenafil (VIAGRA) 100 MG tablet, Take 100 mg by mouth daily as needed., Disp: , Rfl:   No orders of the defined types were placed in this encounter.      Clotilde Dieter, DO, [facility]  Pager: [phone] Office: [phone]

## 2022-07-24 DIAGNOSIS — Z0000 Encounter for general adult medical examination without abnormal findings: Secondary | ICD-10-CM | POA: Diagnosis not present

## 2022-07-24 DIAGNOSIS — D696 Thrombocytopenia, unspecified: Secondary | ICD-10-CM | POA: Diagnosis not present

## 2022-10-02 DIAGNOSIS — Z1331 Encounter for screening for depression: Secondary | ICD-10-CM | POA: Diagnosis not present

## 2022-10-02 DIAGNOSIS — Z1339 Encounter for screening examination for other mental health and behavioral disorders: Secondary | ICD-10-CM | POA: Diagnosis not present

## 2022-10-02 DIAGNOSIS — B351 Tinea unguium: Secondary | ICD-10-CM | POA: Diagnosis not present

## 2022-10-02 DIAGNOSIS — Z0000 Encounter for general adult medical examination without abnormal findings: Secondary | ICD-10-CM | POA: Diagnosis not present

## 2022-10-30 DIAGNOSIS — E782 Mixed hyperlipidemia: Secondary | ICD-10-CM | POA: Diagnosis not present

## 2022-10-30 DIAGNOSIS — I10 Essential (primary) hypertension: Secondary | ICD-10-CM | POA: Diagnosis not present

## 2022-10-30 DIAGNOSIS — R5383 Other fatigue: Secondary | ICD-10-CM | POA: Diagnosis not present

## 2022-11-06 DIAGNOSIS — Z789 Other specified health status: Secondary | ICD-10-CM | POA: Diagnosis not present

## 2022-11-06 DIAGNOSIS — N529 Male erectile dysfunction, unspecified: Secondary | ICD-10-CM | POA: Diagnosis not present

## 2022-11-06 DIAGNOSIS — E782 Mixed hyperlipidemia: Secondary | ICD-10-CM | POA: Diagnosis not present

## 2022-11-06 DIAGNOSIS — I10 Essential (primary) hypertension: Secondary | ICD-10-CM | POA: Diagnosis not present

## 2022-11-06 DIAGNOSIS — Z7185 Encounter for immunization safety counseling: Secondary | ICD-10-CM | POA: Diagnosis not present

## 2022-11-06 DIAGNOSIS — Z23 Encounter for immunization: Secondary | ICD-10-CM | POA: Diagnosis not present

## 2023-01-08 DIAGNOSIS — S0501XA Injury of conjunctiva and corneal abrasion without foreign body, right eye, initial encounter: Secondary | ICD-10-CM | POA: Diagnosis not present

## 2023-01-10 DIAGNOSIS — H18593 Other hereditary corneal dystrophies, bilateral: Secondary | ICD-10-CM | POA: Diagnosis not present

## 2023-01-16 DIAGNOSIS — H18593 Other hereditary corneal dystrophies, bilateral: Secondary | ICD-10-CM | POA: Diagnosis not present

## 2023-01-17 ENCOUNTER — Ambulatory Visit: Payer: Self-pay | Admitting: Cardiology

## 2023-02-10 ENCOUNTER — Ambulatory Visit: Payer: Medicare HMO | Admitting: Cardiology

## 2023-02-10 IMAGING — CR DG SHOULDER 2+V*R*
3 series · 3 of 3 positions shown · non-contrast
Comparison: None.

CLINICAL DATA: Right shoulder pain.  No known injury.

EXAM:
RIGHT SHOULDER - 2+ VIEW

[w shoulder grashey right]
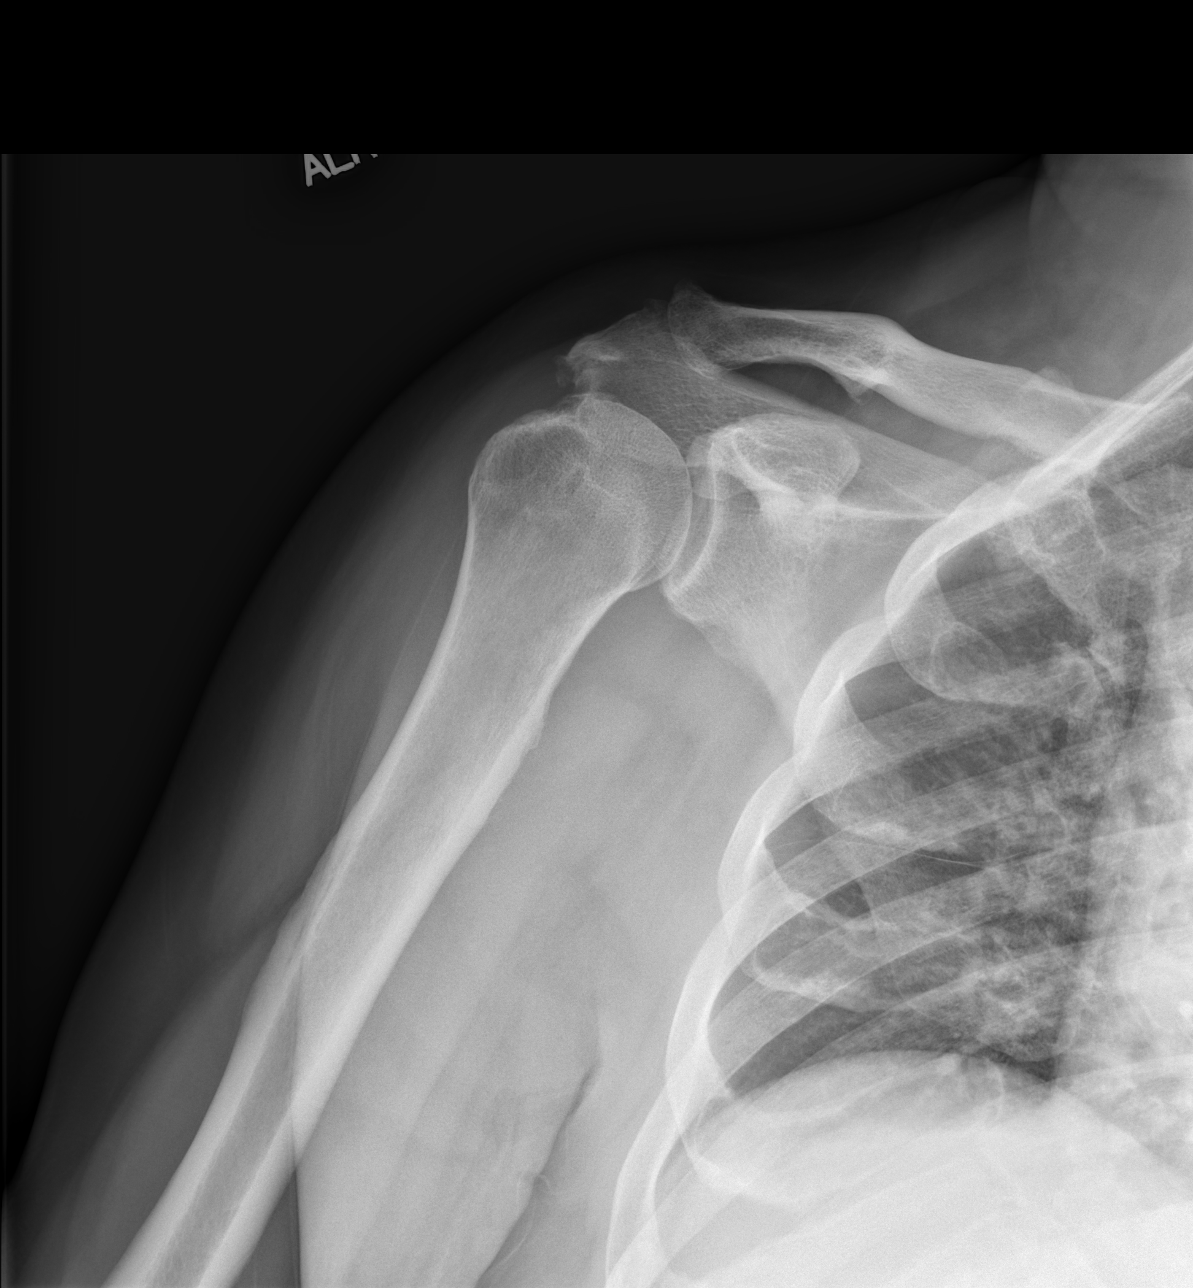

[w shoulder y-view right]
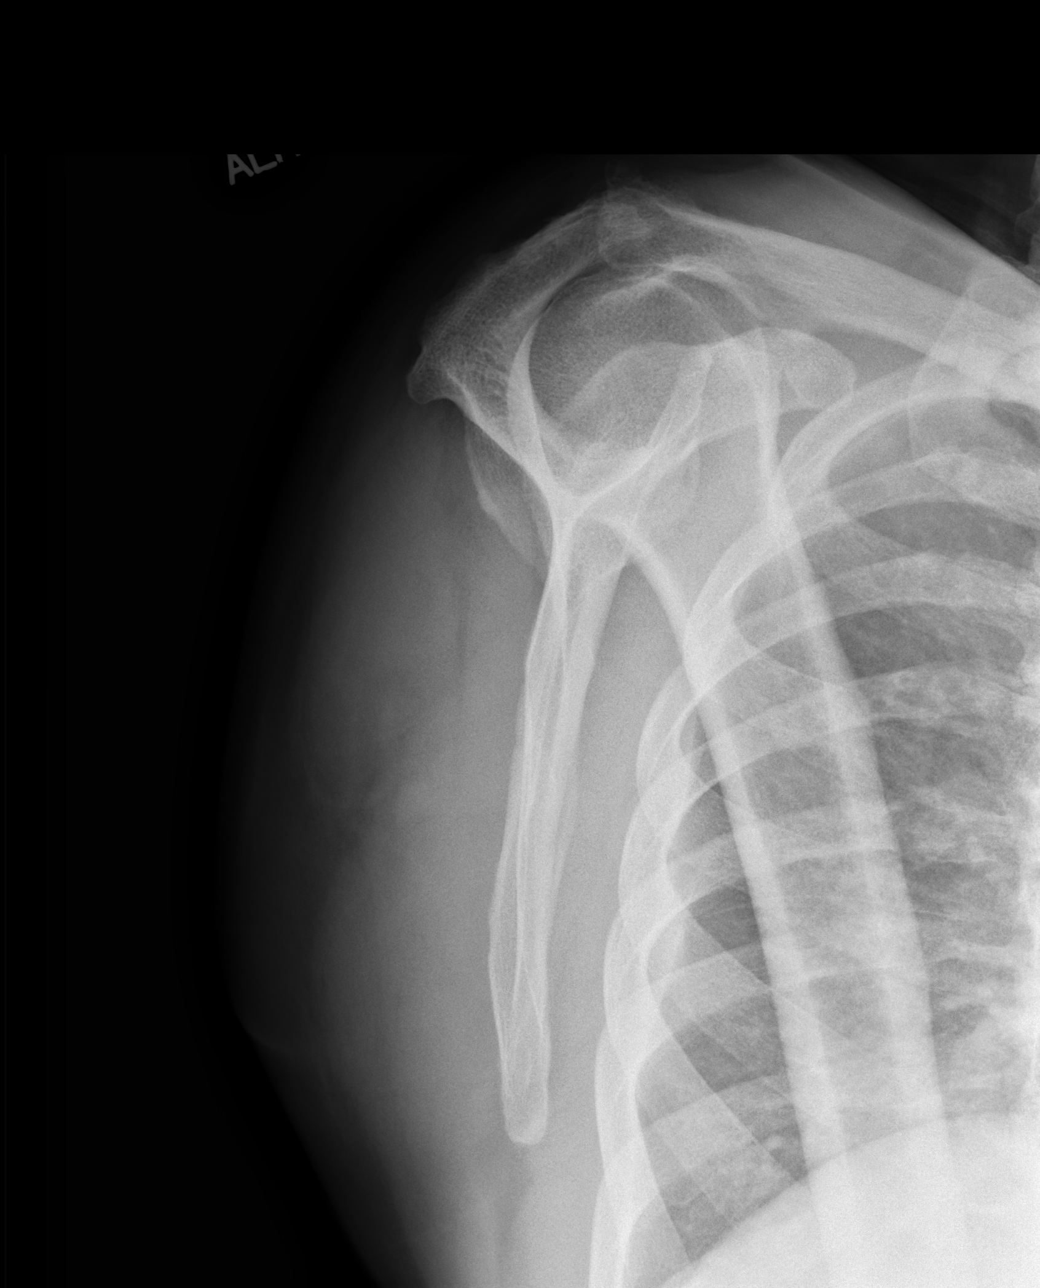

[x shoulder axillary right]
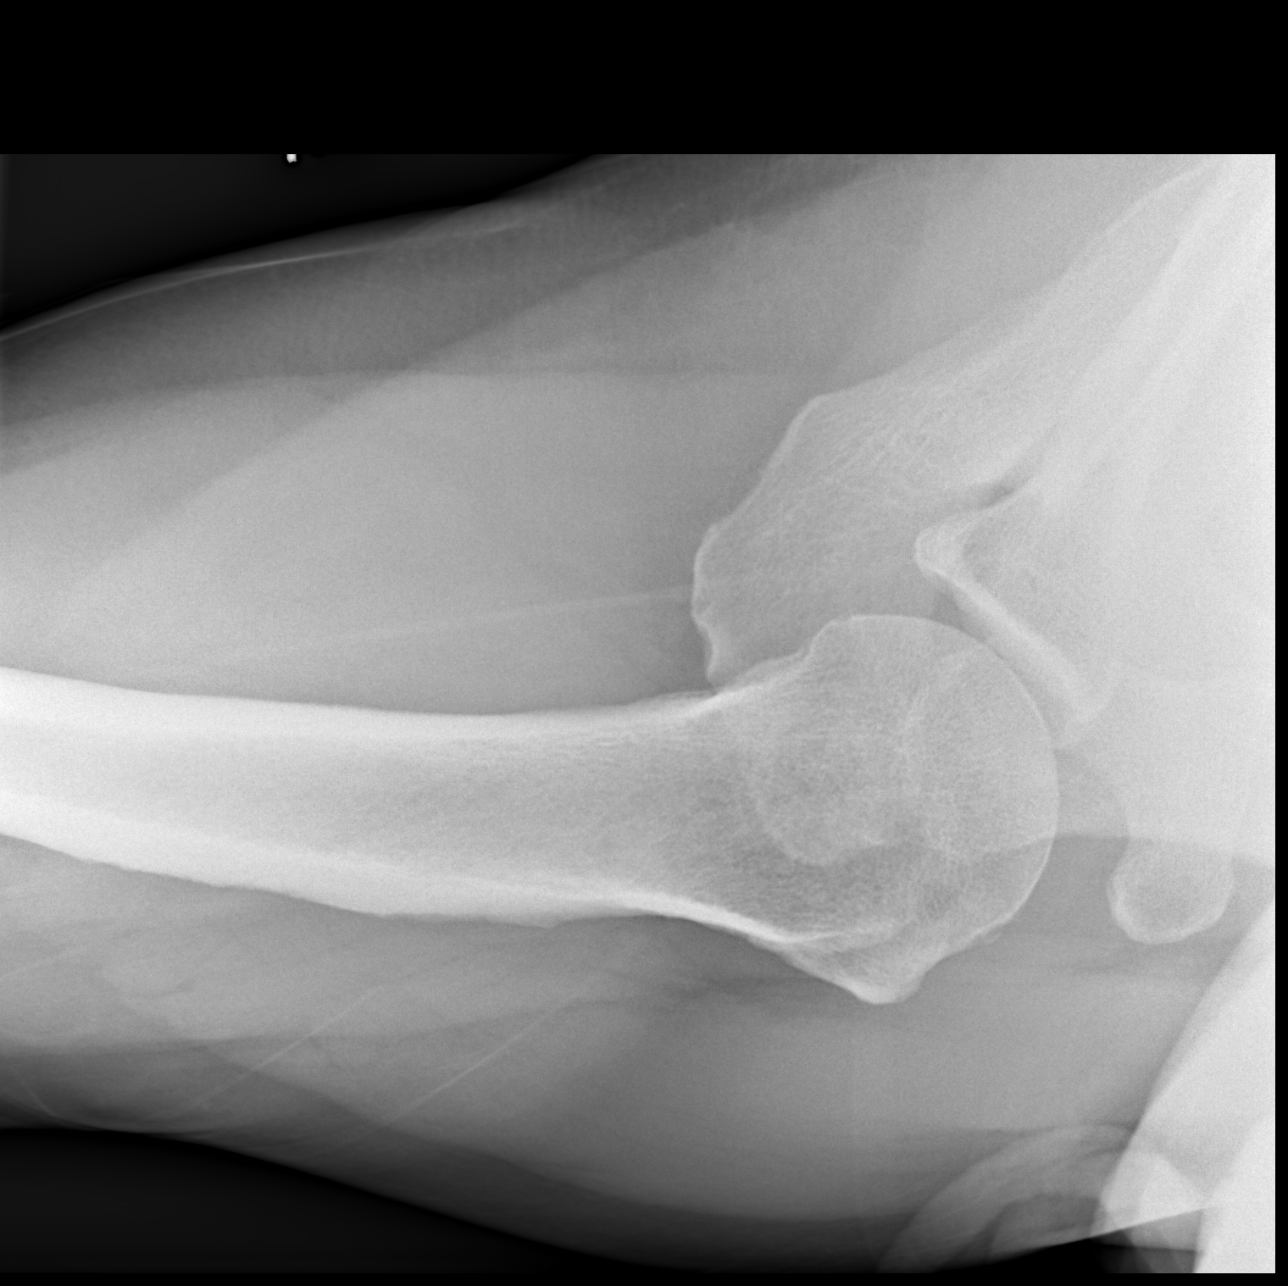

[3 of 3 positions shown; findings below may reference images not displayed]

FINDINGS: Degenerative changes in the AC joint with joint space narrowing and
subacromial spurring. Early spurring at the rotator cuff insertion
on the greater tuberosity. Glenohumeral joint is maintained. No
acute bony abnormality. Specifically, no fracture, subluxation, or
dislocation.
IMPRESSION: Mild degenerative changes in the right shoulder. No acute bony
abnormality.

## 2023-05-28 ENCOUNTER — Ambulatory Visit: Payer: Medicare HMO | Admitting: Cardiology

## 2023-06-02 DIAGNOSIS — L821 Other seborrheic keratosis: Secondary | ICD-10-CM | POA: Diagnosis not present

## 2023-06-02 DIAGNOSIS — D485 Neoplasm of uncertain behavior of skin: Secondary | ICD-10-CM | POA: Diagnosis not present

## 2023-06-02 DIAGNOSIS — L570 Actinic keratosis: Secondary | ICD-10-CM | POA: Diagnosis not present

## 2023-06-02 DIAGNOSIS — L814 Other melanin hyperpigmentation: Secondary | ICD-10-CM | POA: Diagnosis not present

## 2023-06-02 DIAGNOSIS — Z85828 Personal history of other malignant neoplasm of skin: Secondary | ICD-10-CM | POA: Diagnosis not present

## 2023-06-12 ENCOUNTER — Encounter: Payer: Self-pay | Admitting: Cardiology

## 2023-06-12 ENCOUNTER — Ambulatory Visit: Payer: Medicare HMO | Attending: Cardiology | Admitting: Cardiology

## 2023-06-12 VITALS — BP 126/80 | HR 54 | Resp 16 | Ht 68.0 in | Wt 209.2 lb

## 2023-06-12 DIAGNOSIS — I10 Essential (primary) hypertension: Secondary | ICD-10-CM

## 2023-06-12 DIAGNOSIS — E6609 Other obesity due to excess calories: Secondary | ICD-10-CM

## 2023-06-12 DIAGNOSIS — R931 Abnormal findings on diagnostic imaging of heart and coronary circulation: Secondary | ICD-10-CM

## 2023-06-12 DIAGNOSIS — Z6831 Body mass index (BMI) 31.0-31.9, adult: Secondary | ICD-10-CM | POA: Diagnosis not present

## 2023-06-12 DIAGNOSIS — E66811 Obesity, class 1: Secondary | ICD-10-CM

## 2023-06-12 DIAGNOSIS — E782 Mixed hyperlipidemia: Secondary | ICD-10-CM | POA: Diagnosis not present

## 2023-06-12 DIAGNOSIS — I493 Ventricular premature depolarization: Secondary | ICD-10-CM

## 2023-06-12 NOTE — Progress Notes (Signed)
 Cardiology Office Note:  .    ID:  Tony Simon, Tony Simon 1950-11-17, MRN [id number] PCP:  Lewis Moccasin, MD  Former Cardiology Providers: Dr. Clotilde Dieter [facility] Providers Cardiologist:  Tessa Lerner, DO , [facility] (established care 01/11/2020) Electrophysiologist:  None  Click to update primary MD,subspecialty MD or APP then REFRESH:1}    Chief Complaint  Patient presents with   Premature ventricular beat   Follow-up    6 months    History of Present Illness: .   Tony Simon is a 73 y.o.  male whose past medical history and cardiovascular risk factors includes: Coronary artery calcification, Premature ventricular contractions, hypertension, advance age.   Patient was under my care back in 2021 and then later transitioned to Dr. Clotilde Dieter and now is here to reestablish care.  In the past when he was  noted to have premature ventricular contractions.  He was started on Toprol-XL and did well for reasons unknown the medication was discontinued.  Despite discontinuation of medication he has done well.  He participates in regular physical activity such as walking on a treadmill for 25 to 30 minutes 3-4 times a week.  Most recent labs a year ago reviewed independently and noted below.  He has an appointment to see his primary care provider in April/May 2025.  Otherwise he denies anginal chest pain or heart failure symptoms.  And overall physical endurance remains stable.  Review of Systems: .   Review of Systems  Cardiovascular:  Negative for chest pain, claudication, irregular heartbeat, leg swelling, near-syncope, orthopnea, palpitations, paroxysmal nocturnal dyspnea and syncope.  Respiratory:  Negative for shortness of breath.   Hematologic/Lymphatic: Negative for bleeding problem.   Studies Reviewed:   EKG: EKG Interpretation Date/Time:  Thursday June 12 2023 13:27:01 EDT Ventricular Rate:  54 PR Interval:  202 QRS Duration:  90 QT  Interval:  440 QTC Calculation: 417 R Axis:   -41  Text Interpretation: Sinus bradycardia Left axis deviation When compared with ECG of 13-Dec-2003 09:58, No significant change since last tracing Confirmed by Tessa Lerner [phone]) on 06/12/2023 1:43:04 PM  Echocardiogram: 12/2019: LVEF 56%  03/2022: LVEF 60 to 65%, no significant valvular heart disease  Cardiac monitor (Zio Patch): 03/06/2022-03/09/2022 Dominant rhythm sinus. Heart rate 50-140 bpm.  Avg HR 62 bpm. No atrial fibrillation, ventricular tachycardia, high grade AV block, pauses (3 seconds or longer). Total ventricular ectopic burden 6.9% (predominantly as isolated beats). Total supraventricular ectopic burden <1%. Patient triggered events: 0.  Coronary artery calcium score: 04/2022 Total CAC 217 AU,  51st percentile for patient's age, sex, and race. Noncardiac findings: No acute or significant extracardiac abnormality.  RADIOLOGY: NA  Risk Assessment/Calculations:   NA   Labs:    External Labs: Collected: 12/29/2019  Creatinine 0.94 mg/dL. eGFR: >59 mL/min per 1.73 m Lipid profile: Total cholesterol 150, triglycerides 149, HDL 41, LDL 63 TSH: 2.64  External Labs: Collected: 10/30/2022 [facility] database. Total cholesterol 108, triglycerides 143, HDL 43, LDL 42. Hemoglobin 15.6. Creatinine 0.9. Potassium 4.1.   Physical Exam:    Today's Vitals   06/12/23 1328  BP: 126/80  Pulse: (!) 54  Resp: 16  SpO2: 98%  Weight: 209 lb 3.2 oz (94.9 kg)  Height: 5\' 8"  (1.727 m)   Body mass index is 31.81 kg/m. Wt Readings from Last 3 Encounters:  06/12/23 209 lb 3.2 oz (94.9 kg)  07/18/22 208 lb (94.3 kg)  04/18/22 210 lb 9.6 oz (95.5 kg)  Physical Exam  Constitutional: No distress.  hemodynamically stable  Neck: No JVD present.  Cardiovascular: Normal rate, regular rhythm, S1 normal and S2 normal. Exam reveals no gallop, no S3 and no S4.  No murmur heard. Pulmonary/Chest: Effort normal and breath sounds  normal. No stridor. He has no wheezes. He has no rales.  Musculoskeletal:        General: No edema.     Cervical back: Neck supple.  Skin: Skin is warm.     Impression & Recommendation(s):  Impression:   ICD-10-CM   1. 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 Premature ventricular beat  I49.3 EKG 12-Lead    3. Essential hypertension  I10     4. Mixed hyperlipidemia  E78.2     5. Class 1 obesity due to excess calories with serious comorbidity and body mass index (BMI) of 31.0 to 31.9 in adult  E66.811    E66.09    Z68.31        Recommendation(s):  Agatston coronary artery calcium score between 100 and 400 Total CAC 217, 51st percentile. Continue aspirin and statin therapy. Denies anginal chest pain. Overall function capacity remains stable and therefore shared decision was to hold off on repeat stress testing at this time. Prior GXT reported to be low risk. Reemphasized the importance of secondary prevention with focus on improving her modifiable cardiovascular risk factors such as glycemic control, lipid management, blood pressure control, weight loss.  Premature ventricular beat EKG today does not illustrate premature ventricular contractions. Had done well on Toprol-XL in the past but medication for reasons unknown has been discontinued. Patient wishes to monitor it clinically.  Which is reasonable as he is asymptomatic.  Essential hypertension Office blood pressures are very well-controlled. Continue losartan 100 mg p.o. daily. Continue hydrochlorothiazide 12.5 mg p.o. daily  Mixed hyperlipidemia Outside labs from August 2024 via [facility] database independently reviewed. LDL is currently at goal. Continue rosuvastatin 5 mg p.o. daily  Class 1 obesity due to excess calories with serious comorbidity and body mass index (BMI) of 31.0 to 31.9 in adult Body mass index is 31.81 kg/m. I reviewed with him importance of diet, regular physical  activity/exercise, weight loss.   Patient is educated on the importance of increasing physical activity gradually as tolerated with a goal of moderate intensity exercise for 30 minutes a day 5 days a week.  Orders Placed:  Orders Placed This Encounter  Procedures   EKG 12-Lead     Final Medication List:   No orders of the defined types were placed in this encounter.   Medications Discontinued During This Encounter  Medication Reason   meloxicam (MOBIC) 15 MG tablet Patient Preference   metoprolol succinate (TOPROL XL) 25 MG 24 hr tablet Patient Preference   sildenafil (VIAGRA) 100 MG tablet      Current Outpatient Medications:    aspirin EC 81 MG tablet, Take 81 mg by mouth daily. Swallow whole., Disp: , Rfl:    hydrochlorothiazide (HYDRODIURIL) 12.5 MG tablet, Take 1 tablet by mouth daily., Disp: , Rfl:    ibuprofen (ADVIL) 800 MG tablet, Take 1 tablet by mouth as needed., Disp: , Rfl:    losartan (COZAAR) 100 MG tablet, Take 1 tablet by mouth daily., Disp: , Rfl:    Multiple Vitamin (MULTIVITAMIN) tablet, Take 1 tablet by mouth daily., Disp: , Rfl:    rosuvastatin (CRESTOR) 5 MG tablet, Take 5 mg by mouth daily., Disp: ,  Rfl:    sildenafil (VIAGRA) 100 MG tablet, Take 100 mg by mouth daily as needed for erectile dysfunction., Disp: , Rfl:    omeprazole (PRILOSEC) 40 MG capsule, Take 40 mg by mouth as needed. (Patient not taking: Reported on 06/12/2023), Disp: , Rfl:   Consent:   NA  Disposition:   Follow-up in 2 years sooner if needed.  His questions and concerns were addressed to his satisfaction. He voices understanding of the recommendations provided during this encounter.    Signed, Tessa Lerner, DO, [facility]  [facility]  [address] [place], [place] [address]

## 2023-06-12 NOTE — Patient Instructions (Signed)
 Medication Instructions:  Your physician recommends that you continue on your current medications as directed. Please refer to the Current Medication list given to you today.  *If you need a refill on your cardiac medications before your next appointment, please call your pharmacy*  Lab Work: None ordered today. If you have labs (blood work) drawn today and your tests are completely normal, you will receive your results only by: MyChart Message (if you have MyChart) OR A paper copy in the mail If you have any lab test that is abnormal or we need to change your treatment, we will call you to review the results.  Testing/Procedures: None ordered today.  Follow-Up: At [facility], you and your health needs are our priority.  As part of our continuing mission to provide you with exceptional heart care, we have created designated Provider Care Teams.  These Care Teams include your primary Cardiologist (physician) and Advanced Practice Providers (APPs -  Physician Assistants and Nurse Practitioners) who all work together to provide you with the care you need, when you need it.  We recommend signing up for the patient portal called "MyChart".  Sign up information is provided on this After Visit Summary.  MyChart is used to connect with patients for Virtual Visits (Telemedicine).  Patients are able to view lab/test results, encounter notes, upcoming appointments, etc.  Non-urgent messages can be sent to your provider as well.   To learn more about what you can do with MyChart, go to [email or web address].    Your next appointment:   2 year(s)  The format for your next appointment:   In Person  Provider:   Tessa Lerner, DO {  Other Instructions   1st Floor: - Lobby - Registration  - Pharmacy  - Lab - Cafe  2nd Floor: - PV Lab - Diagnostic Testing (echo, CT, nuclear med)  3rd Floor: - Vacant  4th Floor: - TCTS (cardiothoracic surgery) - [facility] - Structural Heart  Clinic - Vascular Surgery  - Vascular Ultrasound  5th Floor: - [facility] (general and EP) - Clinical Pharmacy for coumadin, hypertension, lipid, weight-loss medications, and med management appointments    Valet parking services will be available as well.

## 2023-06-22 ENCOUNTER — Encounter: Payer: Self-pay | Admitting: Cardiology

## 2023-08-04 DIAGNOSIS — E669 Obesity, unspecified: Secondary | ICD-10-CM | POA: Diagnosis not present

## 2023-08-04 DIAGNOSIS — Z0000 Encounter for general adult medical examination without abnormal findings: Secondary | ICD-10-CM | POA: Diagnosis not present

## 2023-08-04 DIAGNOSIS — I10 Essential (primary) hypertension: Secondary | ICD-10-CM | POA: Diagnosis not present

## 2023-12-01 DIAGNOSIS — L821 Other seborrheic keratosis: Secondary | ICD-10-CM | POA: Diagnosis not present

## 2023-12-01 DIAGNOSIS — L814 Other melanin hyperpigmentation: Secondary | ICD-10-CM | POA: Diagnosis not present

## 2023-12-01 DIAGNOSIS — L570 Actinic keratosis: Secondary | ICD-10-CM | POA: Diagnosis not present

## 2023-12-01 DIAGNOSIS — L910 Hypertrophic scar: Secondary | ICD-10-CM | POA: Diagnosis not present

## 2023-12-25 DIAGNOSIS — R5383 Other fatigue: Secondary | ICD-10-CM | POA: Diagnosis not present

## 2023-12-25 DIAGNOSIS — I10 Essential (primary) hypertension: Secondary | ICD-10-CM | POA: Diagnosis not present

## 2023-12-25 DIAGNOSIS — R7989 Other specified abnormal findings of blood chemistry: Secondary | ICD-10-CM | POA: Diagnosis not present

## 2023-12-25 DIAGNOSIS — E782 Mixed hyperlipidemia: Secondary | ICD-10-CM | POA: Diagnosis not present

## 2024-01-01 DIAGNOSIS — R7989 Other specified abnormal findings of blood chemistry: Secondary | ICD-10-CM | POA: Diagnosis not present

## 2024-01-01 DIAGNOSIS — Z23 Encounter for immunization: Secondary | ICD-10-CM | POA: Diagnosis not present

## 2024-01-01 DIAGNOSIS — Z789 Other specified health status: Secondary | ICD-10-CM | POA: Diagnosis not present

## 2024-01-01 DIAGNOSIS — M25812 Other specified joint disorders, left shoulder: Secondary | ICD-10-CM | POA: Diagnosis not present

## 2024-01-01 DIAGNOSIS — Z79899 Other long term (current) drug therapy: Secondary | ICD-10-CM | POA: Diagnosis not present

## 2024-01-01 DIAGNOSIS — R7303 Prediabetes: Secondary | ICD-10-CM | POA: Diagnosis not present

## 2024-01-01 DIAGNOSIS — I10 Essential (primary) hypertension: Secondary | ICD-10-CM | POA: Diagnosis not present

## 2024-01-01 DIAGNOSIS — E782 Mixed hyperlipidemia: Secondary | ICD-10-CM | POA: Diagnosis not present

## 2024-01-01 DIAGNOSIS — E1165 Type 2 diabetes mellitus with hyperglycemia: Secondary | ICD-10-CM | POA: Diagnosis not present

## 2024-01-01 DIAGNOSIS — Z7185 Encounter for immunization safety counseling: Secondary | ICD-10-CM | POA: Diagnosis not present

## 2024-01-01 DIAGNOSIS — N529 Male erectile dysfunction, unspecified: Secondary | ICD-10-CM | POA: Diagnosis not present
